# Patient Record
Sex: Male | Born: 1960 | Race: Black or African American | Hispanic: No | Smoking: Current every day smoker
Health system: Southern US, Community
[De-identification: ages and names within clinical notes are randomized; demographics above are authoritative.]

## PROBLEM LIST (undated history)

## (undated) DIAGNOSIS — Z972 Presence of dental prosthetic device (complete) (partial): Secondary | ICD-10-CM

## (undated) DIAGNOSIS — M549 Dorsalgia, unspecified: Secondary | ICD-10-CM

## (undated) DIAGNOSIS — F1910 Other psychoactive substance abuse, uncomplicated: Secondary | ICD-10-CM

## (undated) DIAGNOSIS — N200 Calculus of kidney: Secondary | ICD-10-CM

## (undated) DIAGNOSIS — G8929 Other chronic pain: Secondary | ICD-10-CM

## (undated) DIAGNOSIS — M25569 Pain in unspecified knee: Secondary | ICD-10-CM

## (undated) DIAGNOSIS — K08109 Complete loss of teeth, unspecified cause, unspecified class: Secondary | ICD-10-CM

## (undated) DIAGNOSIS — E785 Hyperlipidemia, unspecified: Secondary | ICD-10-CM

## (undated) DIAGNOSIS — B159 Hepatitis A without hepatic coma: Secondary | ICD-10-CM

## (undated) HISTORY — DX: Hyperlipidemia, unspecified: E78.5

## (undated) HISTORY — DX: Calculus of kidney: N20.0

## (undated) HISTORY — DX: Complete loss of teeth, unspecified cause, unspecified class: K08.109

## (undated) HISTORY — DX: Other chronic pain: G89.29

## (undated) HISTORY — PX: TONSILLECTOMY: SUR1361

## (undated) HISTORY — DX: Dorsalgia, unspecified: M54.9

## (undated) HISTORY — DX: Presence of dental prosthetic device (complete) (partial): Z97.2

---

## 2007-01-28 ENCOUNTER — Emergency Department (HOSPITAL_COMMUNITY): Admission: EM | Admit: 2007-01-28 | Discharge: 2007-01-28 | Payer: Self-pay | Admitting: Family Medicine

## 2008-09-24 ENCOUNTER — Emergency Department (HOSPITAL_COMMUNITY): Admission: EM | Admit: 2008-09-24 | Discharge: 2008-09-24 | Payer: Self-pay | Admitting: Emergency Medicine

## 2008-09-24 ENCOUNTER — Ambulatory Visit: Payer: Self-pay | Admitting: Cardiology

## 2009-03-19 ENCOUNTER — Emergency Department (HOSPITAL_COMMUNITY): Admission: EM | Admit: 2009-03-19 | Discharge: 2009-03-19 | Payer: Self-pay | Admitting: Emergency Medicine

## 2009-09-07 ENCOUNTER — Emergency Department (HOSPITAL_COMMUNITY): Admission: EM | Admit: 2009-09-07 | Discharge: 2009-09-07 | Payer: Self-pay | Admitting: Emergency Medicine

## 2010-01-06 ENCOUNTER — Emergency Department (HOSPITAL_COMMUNITY): Admission: EM | Admit: 2010-01-06 | Discharge: 2010-01-06 | Payer: Self-pay | Admitting: Emergency Medicine

## 2010-02-28 IMAGING — CR DG CHEST 1V PORT
1 series · 1 of 1 positions shown · non-contrast
Comparison: None

CLINICAL DATA: Abdominal pain, nausea

PORTABLE CHEST - 1 VIEW

[AP]
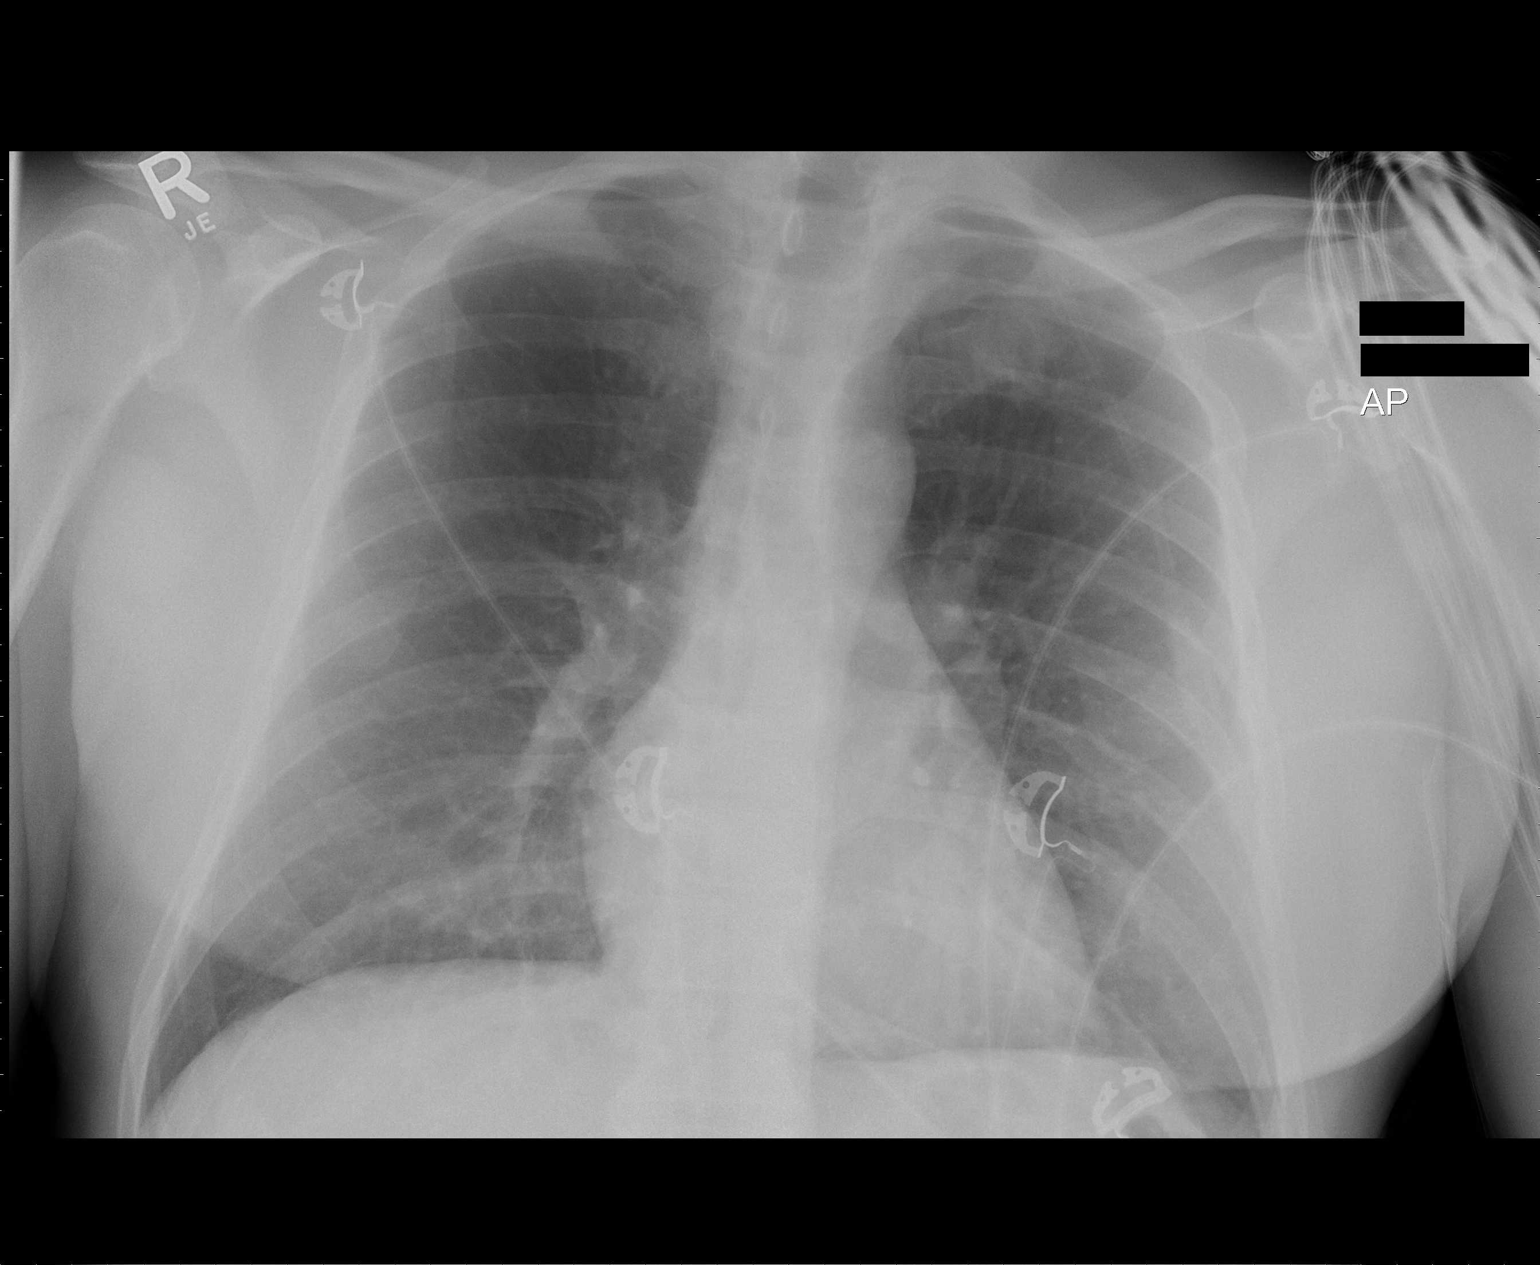

[1 of 1 positions shown; findings below may reference images not displayed]

FINDINGS: Cardiomediastinal silhouette is unremarkable.  No acute
infiltrate or pleural effusion.  No pulmonary edema.
IMPRESSION: No active disease.

## 2010-11-21 LAB — POCT I-STAT, CHEM 8
BUN: 12 mg/dL (ref 6–23)
Chloride: 103 mEq/L (ref 96–112)
Creatinine, Ser: 1.1 mg/dL (ref 0.4–1.5)
Glucose, Bld: 96 mg/dL (ref 70–99)
HCT: 46 % (ref 39.0–52.0)
Hemoglobin: 15.6 g/dL (ref 13.0–17.0)
Potassium: 4.3 mEq/L (ref 3.5–5.1)
Sodium: 139 mEq/L (ref 135–145)

## 2010-12-20 LAB — CBC
HCT: 44.6 % (ref 39.0–52.0)
Hemoglobin: 14.9 g/dL (ref 13.0–17.0)
MCHC: 33.2 g/dL (ref 30.0–36.0)
MCHC: 33.9 g/dL (ref 30.0–36.0)
Platelets: 253 10*3/uL (ref 150–400)
Platelets: 257 10*3/uL (ref 150–400)
RBC: 4.99 MIL/uL (ref 4.22–5.81)
WBC: 7.7 10*3/uL (ref 4.0–10.5)
WBC: 7.9 10*3/uL (ref 4.0–10.5)

## 2010-12-20 LAB — COMPREHENSIVE METABOLIC PANEL
AST: 28 U/L (ref 0–37)
BUN: 15 mg/dL (ref 6–23)
CO2: 27 mEq/L (ref 19–32)
Glucose, Bld: 97 mg/dL (ref 70–99)
Total Bilirubin: 0.7 mg/dL (ref 0.3–1.2)

## 2010-12-20 LAB — TROPONIN I: Troponin I: <0.01 ng/mL (ref 0.00–0.06)

## 2010-12-20 LAB — POCT CARDIAC MARKERS
CKMB, poc: 2.5 ng/mL (ref 1.0–8.0)
Myoglobin, poc: 78.1 ng/mL (ref 12–200)

## 2010-12-20 LAB — LIPASE, BLOOD: Lipase: 15 U/L (ref 11–59)

## 2010-12-20 LAB — CK TOTAL AND CKMB (NOT AT ARMC): Total CK: 581 U/L — ABNORMAL HIGH (ref 7–232)

## 2010-12-20 LAB — GLUCOSE, CAPILLARY: Glucose-Capillary: 127 mg/dL — ABNORMAL HIGH (ref 70–99)

## 2010-12-20 LAB — AMYLASE: Amylase: 74 U/L (ref 27–131)

## 2010-12-20 LAB — DIFFERENTIAL
Basophils Relative: 0 % (ref 0–1)
Eosinophils Relative: 2 % (ref 0–5)
Neutro Abs: 6.5 10*3/uL (ref 1.7–7.7)
Neutrophils Relative %: 83 % — ABNORMAL HIGH (ref 43–77)

## 2010-12-20 LAB — PROTIME-INR
INR: 1 (ref 0.00–1.49)
Prothrombin Time: 13.6 seconds (ref 11.6–15.2)

## 2010-12-20 LAB — TSH: TSH: 1.175 u[IU]/mL (ref 0.350–4.500)

## 2011-01-18 NOTE — H&P (Signed)
NAME:  AVA, DEGUIRE NO.:  [id number]   MEDICAL RECORD NO.:  [id number]          PATIENT TYPE:  EMS   LOCATION:  [facility]                         FACILITY:  [facility]   PHYSICIAN:  Arturo Morton. Riley Kill, MD, FACCDATE OF BIRTH:  08/11/1961   DATE OF ADMISSION:  09/24/2008  DATE OF DISCHARGE:                              HISTORY & PHYSICAL   CHIEF COMPLAINT:  Epigastric pain.   HISTORY OF PRESENT ILLNESS:  Mr. 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  He had  sudden onset of some epigastric discomfort.  He had not had this in the  past.  He has no prior history.  He became nauseated, sweating.  This  radiated around the left lower rib cage and left upper quadrant.  It is  now much better.  We gave him a GI cocktail in the emergency room as  well as chewable aspirin.  The electrocardiogram in the emergency room  demonstrates normal sinus rhythm.  There is J-point elevation noted in  V1, V2.  V2 is approximately just over a millimeter, and V1 is  approximately a millimeter.  The remaining leads have no significant ST  elevation.  There is T-wave inversion in III and biphasicity in aVF,  although these are normal findings.  The EKG is not felt to be  diagnostic, first set of cardiac enzymes done here in the emergency room  promptly did not reveal an elevated myoglobin.  His symptoms are  markedly improved with the GI cocktail.  He is being admitted for  further evaluation and rule out MI.   Past medical history is remarkable for a history of hypertension.  He  said he did lifestyle changes and this improved.  He has not had his  cholesterol rechecked.  He has no primary care physician.  The patient  has not had prior surgery and has no allergies.  He currently takes no  medications.   FAMILY HISTORY:  His father is in his 67s and has had an MI in the past.  His mother died of complications of alcoholism.  He has one  twin brother  who is in good health.   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  He smokes about half-  a-pack per day.  He has a girlfriend, and they have 36-year-old and 8-  year-olds.  He has an occasional beer.   REVIEW OF SYSTEMS:  The patient has not had GI bleeding or major GI  problems.  He has not had exertional chest pain in the past.  He has not  had cough, fever, diarrhea, or other major symptoms.  His hearing is  good, although he says his girlfriend would argue with that.  His vision  has been without difficulty.  A complete review of systems is otherwise  negative.   PHYSICAL EXAMINATION:  GENERAL:  He is alert, oriented, thin African  American male in no acute distress.  VITAL SIGNS:  The blood pressure is 100/70, the pulse is 67, the  temperature is afebrile, the respiratory  rate is normal at 16.  NECK:  The jugular veins are not significantly distended.  The carotid  upstrokes are brisk.  There is no bruit.  HEENT:  There is facial acne particularly under the left mandible.  Uvula is midline.  Extraocular muscles are intact.  Pupils are equal and  reactive to light and accommodation.  The sclerae are clear.  The  external ear canals are not examined.  They are normal set.  His facial  features are normal.  Cranial nerves are intact grossly.  LUNGS:  The lung fields are clear to auscultation and percussion.  HEART:  The PMI is nondisplaced.  There is no significant murmur, rub,  or gallop noted.  ABDOMEN:  Good bowel sounds.  There is some slight tenderness in the  midepigastric area that radiates down to the left upper quadrant.  The  femoral pulses are intact.  Distal pulses are intact.   The electrocardiogram demonstrates normal sinus rhythm.  PR interval is  140 milliseconds, QRS duration 94, QTc is 372 milliseconds.  There is  mild J-point elevation in V1 and V2 less than V3.  These are normal  variants.  T-wave inversion in III is also normal  variant.   Initial myoglobin is 78.1 with a CK-MB of 2.5, hemoglobin of 15.1,  hematocrit of 44.6, and platelet count 257.  Amylase and lipase are  pending.   IMPRESSION:  1. Acute epigastric discomfort, now improved, question etiology.  2. Borderline EKG, probable normal variant.  3. Questionable history of hypertension in the past.  4. History of hypercholesterolemia.   PLAN:  1. We will admit to the CCU for close observation.  2. Serial cardiac enzymes will be obtained.  3. The patient will be started on a low-dose heparin drip and has been      given chewable aspirin.  4. Lipase and amylase will be obtained.  5. Portable chest x-ray will be obtained as well.  6. He will be started on Protonix and given p.r.n. Zofran.      Arturo Morton. Riley Kill, MD, [facility]  Electronically Signed     TDS/MEDQ  D:  09/24/2008  T:  09/25/2008  Job:  [id number]

## 2011-02-10 ENCOUNTER — Encounter: Payer: Self-pay | Admitting: Internal Medicine

## 2011-02-10 ENCOUNTER — Ambulatory Visit (INDEPENDENT_AMBULATORY_CARE_PROVIDER_SITE_OTHER): Payer: 59 | Admitting: Internal Medicine

## 2011-02-10 DIAGNOSIS — R1032 Left lower quadrant pain: Secondary | ICD-10-CM

## 2011-02-10 DIAGNOSIS — Z0000 Encounter for general adult medical examination without abnormal findings: Secondary | ICD-10-CM

## 2011-02-10 DIAGNOSIS — R413 Other amnesia: Secondary | ICD-10-CM

## 2011-02-10 DIAGNOSIS — N529 Male erectile dysfunction, unspecified: Secondary | ICD-10-CM

## 2011-02-10 DIAGNOSIS — R1031 Right lower quadrant pain: Secondary | ICD-10-CM

## 2011-02-10 DIAGNOSIS — M549 Dorsalgia, unspecified: Secondary | ICD-10-CM

## 2011-02-10 DIAGNOSIS — R109 Unspecified abdominal pain: Secondary | ICD-10-CM

## 2011-02-10 DIAGNOSIS — N200 Calculus of kidney: Secondary | ICD-10-CM

## 2011-02-10 DIAGNOSIS — M25562 Pain in left knee: Secondary | ICD-10-CM

## 2011-02-10 DIAGNOSIS — M25569 Pain in unspecified knee: Secondary | ICD-10-CM

## 2011-02-10 DIAGNOSIS — G8929 Other chronic pain: Secondary | ICD-10-CM

## 2011-02-10 LAB — POCT URINALYSIS DIPSTICK
Blood, UA: NEGATIVE
Protein, UA: NEGATIVE
Spec Grav, UA: 1.03
Urobilinogen, UA: 0.2

## 2011-02-10 MED ORDER — VARDENAFIL HCL 10 MG PO TABS: ORAL_TABLET | ORAL | Status: DC

## 2011-02-10 MED ORDER — MELOXICAM 7.5 MG PO TABS: ORAL_TABLET | ORAL | Status: AC

## 2011-02-11 LAB — CBC WITH DIFFERENTIAL/PLATELET
Basophils Relative: 0.3 % (ref 0.0–3.0)
Eosinophils Absolute: 0.2 10*3/uL (ref 0.0–0.7)
Eosinophils Relative: 3.7 % (ref 0.0–5.0)
HCT: 39.7 % (ref 39.0–52.0)
Lymphocytes Relative: 41.6 % (ref 12.0–46.0)
Lymphs Abs: 2.7 10*3/uL (ref 0.7–4.0)
MCV: 89.8 fl (ref 78.0–100.0)
Monocytes Relative: 6.6 % (ref 3.0–12.0)
Neutrophils Relative %: 47.8 % (ref 43.0–77.0)
WBC: 6.5 10*3/uL (ref 4.5–10.5)

## 2011-02-11 LAB — PSA: PSA: 0.56 ng/mL (ref 0.10–4.00)

## 2011-02-11 LAB — BASIC METABOLIC PANEL
GFR: 89.87 mL/min (ref >60.00)
Sodium: 140 mEq/L (ref 135–145)

## 2011-02-11 LAB — LIPID PANEL
Cholesterol: 269 mg/dL — ABNORMAL HIGH (ref 0–200)
HDL: 57.7 mg/dL (ref >39.00)
VLDL: 48.6 mg/dL — ABNORMAL HIGH (ref 0.0–40.0)

## 2011-02-11 LAB — VITAMIN B12: Vitamin B-12: 432 pg/mL (ref 211–911)

## 2011-02-11 LAB — RPR

## 2011-02-11 LAB — TSH: TSH: 0.32 u[IU]/mL — ABNORMAL LOW (ref 0.35–5.50)

## 2011-02-11 LAB — HEPATIC FUNCTION PANEL: Alkaline Phosphatase: 55 U/L (ref 39–117)

## 2011-02-11 LAB — LDL CHOLESTEROL, DIRECT: Direct LDL: 126.2 mg/dL

## 2011-02-12 ENCOUNTER — Encounter: Payer: Self-pay | Admitting: Internal Medicine

## 2011-02-12 DIAGNOSIS — N200 Calculus of kidney: Secondary | ICD-10-CM | POA: Insufficient documentation

## 2011-02-12 DIAGNOSIS — M25562 Pain in left knee: Secondary | ICD-10-CM | POA: Insufficient documentation

## 2011-02-12 DIAGNOSIS — N529 Male erectile dysfunction, unspecified: Secondary | ICD-10-CM | POA: Insufficient documentation

## 2011-02-12 DIAGNOSIS — Z0000 Encounter for general adult medical examination without abnormal findings: Secondary | ICD-10-CM | POA: Insufficient documentation

## 2011-02-12 DIAGNOSIS — R413 Other amnesia: Secondary | ICD-10-CM | POA: Insufficient documentation

## 2011-02-12 DIAGNOSIS — M549 Dorsalgia, unspecified: Secondary | ICD-10-CM | POA: Insufficient documentation

## 2011-02-12 DIAGNOSIS — G8929 Other chronic pain: Secondary | ICD-10-CM | POA: Insufficient documentation

## 2011-02-12 DIAGNOSIS — R1031 Right lower quadrant pain: Secondary | ICD-10-CM | POA: Insufficient documentation

## 2011-02-12 NOTE — Progress Notes (Signed)
  Subjective:    Patient ID: Timothy Melton, male    DOB: 01-17-1961, 50 y.o.   MRN: [id number]  HPI Pt presents to clinic to establish care and for CPE. Notes chronic back pain x ~5 years without injury/trauma. Pain worse with lifting and is intermittent. S/p chiropractic care but no other evaluation. Denies radicular leg pain or leg weakness but does not intermittent leg numbness. H/o intermittent left knee pain and swelling without h/o injury. No instability. Pain located primarily in medial aspect. Wife notes progressive mild memory loss x 3-4 months without neurologic deficit. Leaves lights on in car or leaves house front door unlocked.  No exacerbating or alleviating factors. Has recent bilateral lower abdominal pain intermittently. + associated nausea and describes discomfort as cramp like. Not affected by food or bowel movements. C/o diminished erections with intact libidio. No other complaints.  Reviewed pmh, psh, medications, allergies, social hx and family hx.    Review of Systems  Constitutional: Negative for fever, fatigue and unexpected weight change.  Respiratory: Negative for cough and shortness of breath.   Cardiovascular: Negative for chest pain and palpitations.  Gastrointestinal: Positive for nausea and abdominal pain. Negative for vomiting, constipation and blood in stool.  Musculoskeletal: Positive for back pain and arthralgias.  Neurological: Positive for numbness. Negative for weakness.  All other systems reviewed and are negative.       Objective:   Physical Exam  Nursing note and vitals reviewed. Constitutional: He appears well-developed and well-nourished. No distress.  HENT:  Head: Normocephalic and atraumatic.  Right Ear: External ear normal.  Left Ear: External ear normal.  Nose: Nose normal.  Mouth/Throat: Oropharynx is clear and moist. No oropharyngeal exudate.  Eyes: Conjunctivae and EOM are normal. Pupils are equal, round, and reactive to light. Right  eye exhibits no discharge. Left eye exhibits no discharge. No scleral icterus.  Neck: Normal range of motion. Neck supple. No JVD present. No thyromegaly present.  Cardiovascular: Normal rate, regular rhythm and normal heart sounds.  Exam reveals no gallop and no friction rub.   No murmur heard. Pulmonary/Chest: Effort normal and breath sounds normal. No respiratory distress. He has no wheezes. He has no rales.  Abdominal: Soft. Bowel sounds are normal. He exhibits no distension. There is no tenderness. There is no rebound and no guarding.  Genitourinary: Rectum normal and prostate normal. Guaiac negative stool.  Musculoskeletal:       No midline ls tenderness or bony abn. Bilateral le strength 5/5. slr negative bilaterally. Left knee without erythema, warmth or effusion. + tenderness medial lower aspect without bony abn.   Lymphadenopathy:    He has no cervical adenopathy.  Neurological: He is alert. No cranial nerve deficit.  Skin: Skin is warm and dry. No rash noted. He is not diaphoretic. No erythema.  Psychiatric: He has a normal mood and affect.          Assessment & Plan:

## 2011-02-12 NOTE — Assessment & Plan Note (Signed)
Neurologically nonfocal. Obtain B12, TSH and RPR. Followup if no improvement or worsening.

## 2011-02-12 NOTE — Assessment & Plan Note (Signed)
Begin lab evaluation and schedule GI consult for consideration of colonoscopy

## 2011-02-12 NOTE — Assessment & Plan Note (Signed)
Obtain knee radiograph and begin mobic prn with food and no other nsaids. Followup if no improvement or worsening

## 2011-02-12 NOTE — Assessment & Plan Note (Signed)
Exam nl. Obtain screening labs including psa (discussed potential risks and benefits) Schedule GI appt for colonoscopy.

## 2011-02-12 NOTE — Assessment & Plan Note (Signed)
Obtain plain ls radiograph and begin mobic prn. Followup if no improvement or worsening.

## 2011-02-12 NOTE — Assessment & Plan Note (Signed)
Attempt levitra 10mg  prn. Dosing instructions and potential side effects reviewed.

## 2011-02-14 ENCOUNTER — Telehealth: Payer: Self-pay | Admitting: Internal Medicine

## 2011-02-14 ENCOUNTER — Other Ambulatory Visit: Payer: Self-pay | Admitting: Internal Medicine

## 2011-02-14 NOTE — Telephone Encounter (Signed)
Timothy Melton will notify the pt of his appt.

## 2011-02-14 NOTE — Telephone Encounter (Signed)
LMOM for Timothy Melton at Dr Hodgin's ofc to call back. I scheduled pt for an appt for Wednesday, 02/16/11 at 1:45pm, arrive at 1:30pm.

## 2011-02-15 ENCOUNTER — Encounter: Payer: Self-pay | Admitting: *Deleted

## 2011-02-15 ENCOUNTER — Telehealth: Payer: Self-pay

## 2011-02-15 NOTE — Telephone Encounter (Signed)
Message copied by Beverely Low on Tue Feb 15, 2011  4:47 PM ------      Message from: Staci Righter      Created: Mon Feb 14, 2011  8:01 PM       Thyroid test was abn. Return to lab for further testing.  Low fat diet and exercise for cholesterol. Other labs ok.

## 2011-02-15 NOTE — Telephone Encounter (Signed)
Pt's wife notified. Lab appt made

## 2011-02-16 ENCOUNTER — Ambulatory Visit: Payer: 59 | Admitting: Internal Medicine

## 2011-02-17 ENCOUNTER — Other Ambulatory Visit: Payer: 59

## 2011-02-18 ENCOUNTER — Other Ambulatory Visit (INDEPENDENT_AMBULATORY_CARE_PROVIDER_SITE_OTHER): Payer: 59

## 2011-02-18 DIAGNOSIS — R946 Abnormal results of thyroid function studies: Secondary | ICD-10-CM

## 2011-02-21 ENCOUNTER — Telehealth: Payer: Self-pay | Admitting: *Deleted

## 2011-02-21 NOTE — Telephone Encounter (Signed)
Pt states forms were left for Dr. Rodena Medin to complete, checking status.

## 2011-02-21 NOTE — Telephone Encounter (Signed)
Left message for pt to call back if referring to forms other than FMLA documents that Dr. Rodena Medin completed last week. Pt was notified that those forms were ready for him to pick up

## 2011-02-23 ENCOUNTER — Telehealth: Payer: Self-pay

## 2011-02-23 NOTE — Telephone Encounter (Signed)
Left message to notify pt repeat thyroid test nl

## 2011-02-23 NOTE — Telephone Encounter (Signed)
Left another message for pt to call back if the paperwork referred to in the phone note is something different than the FMLA documents completed last week

## 2011-02-23 NOTE — Telephone Encounter (Signed)
Message copied by Beverely Low on Wed Feb 23, 2011  3:19 PM ------      Message from: Staci Righter      Created: Fri Feb 18, 2011  5:27 PM       Repeat thyroid tests nl

## 2011-03-29 ENCOUNTER — Encounter: Payer: Self-pay | Admitting: Internal Medicine

## 2011-03-29 ENCOUNTER — Ambulatory Visit (INDEPENDENT_AMBULATORY_CARE_PROVIDER_SITE_OTHER): Payer: 59 | Admitting: Internal Medicine

## 2011-03-29 VITALS — BP 110/72 | HR 92 | Ht 64.0 in | Wt 165.8 lb

## 2011-03-29 DIAGNOSIS — Z1211 Encounter for screening for malignant neoplasm of colon: Secondary | ICD-10-CM

## 2011-03-29 DIAGNOSIS — R1084 Generalized abdominal pain: Secondary | ICD-10-CM

## 2011-03-29 MED ORDER — PEG-KCL-NACL-NASULF-NA ASC-C 100 G PO SOLR: 1.0000 | Freq: Once | ORAL | Status: DC

## 2011-03-29 NOTE — Progress Notes (Signed)
HISTORY OF PRESENT ILLNESS:  Timothy Melton is a 50 y.o. male with a history of kidney stones. He presents today, and consultative referral, regarding transient abdominal discomfort and the need for colonoscopy. The patient reports having had a 1-2 day history of lower abdominal cramping discomfort, in early June 2012. This has resolved without recurrence. He thought possibly a kidney stone. He denies any change in bowel habits or bleeding. No weight loss. His GI review of symptoms is currently entirely negative.  REVIEW OF SYSTEMS:  All non-GI ROS negative except for intermittent back pain  Past Medical History  Diagnosis Date  . Kidney stones   . Chronic back pain   . Memory loss     Past Surgical History  Procedure Date  . Tonsillectomy     Social History [name]  reports that he has been smoking Cigarettes.  He has been smoking about 1 pack per day. He has never used smokeless tobacco. He reports that he drinks alcohol. He reports that he does not use illicit drugs.  family history includes Alcohol abuse in his mother; Diabetes in his father; Heart disease in his father; Hypertension in his father; and Stroke in his father.  No Known Allergies     PHYSICAL EXAMINATION: Vital signs: BP 110/72  Pulse 92  Ht 5\' 4"  (1.626 m)  Wt 165 lb 12.8 oz (75.206 kg)  BMI 28.46 kg/m2  Constitutional: generally well-appearing, no acute distress Psychiatric: alert and oriented x3, cooperative Eyes: extraocular movements intact, anicteric, conjunctiva pink Mouth: oral pharynx moist, no lesions Neck: supple no lymphadenopathy Cardiovascular: heart regular rate and rhythm, no murmur Lungs: clear to auscultation bilaterally Abdomen: soft, nontender, nondistended, no obvious ascites, no peritoneal signs, normal bowel sounds, no organomegaly Rectal: Deferred until colonoscopy Extremities: no lower extremity edema bilaterally Skin: no lesions on visible extremities Neuro: No focal  deficits.   ASSESSMENT:  #1. Transient short-lived lower abdominal discomfort without recurrence. Nonspecific. Recommend observation for evidence of recurrence. Plans for screening colonoscopy #2. Colon cancer screening. Baseline risk. Appropriate candidate without contraindication   PLAN:  #1. Colonoscopy.The nature of the procedure, as well as the risks, benefits, and alternatives were carefully and thoroughly reviewed with the patient. Ample time for discussion and questions allowed. The patient understood, was satisfied, and agreed to proceed. Movi prep prescribed. The patient instructed on its use

## 2011-03-29 NOTE — Patient Instructions (Signed)
Colonoscopy [facility] 05/12/11 11:30 am arrive at 10:30 am on 4th floor Moviprep sent to your pharmacy. Colonoscopy brochure given to you for review.

## 2011-05-12 ENCOUNTER — Other Ambulatory Visit: Payer: 59 | Admitting: Internal Medicine

## 2011-07-31 ENCOUNTER — Emergency Department (HOSPITAL_COMMUNITY)
Admission: EM | Admit: 2011-07-31 | Discharge: 2011-07-31 | Disposition: A | Discharge status: Home / Self Care | Payer: 59 | Attending: Emergency Medicine | Admitting: Emergency Medicine

## 2011-07-31 ENCOUNTER — Emergency Department (HOSPITAL_COMMUNITY): Payer: 59

## 2011-07-31 ENCOUNTER — Encounter (HOSPITAL_COMMUNITY): Payer: Self-pay | Admitting: Emergency Medicine

## 2011-07-31 DIAGNOSIS — M25562 Pain in left knee: Secondary | ICD-10-CM

## 2011-07-31 DIAGNOSIS — F17200 Nicotine dependence, unspecified, uncomplicated: Secondary | ICD-10-CM | POA: Insufficient documentation

## 2011-07-31 DIAGNOSIS — M25569 Pain in unspecified knee: Secondary | ICD-10-CM | POA: Insufficient documentation

## 2011-07-31 MED ORDER — OXYCODONE-ACETAMINOPHEN 5-325 MG PO TABS: 1.0000 | ORAL_TABLET | ORAL | Status: AC | PRN

## 2011-07-31 MED ORDER — NAPROXEN 500 MG PO TABS: 500.0000 mg | ORAL_TABLET | Freq: Two times a day (BID) | ORAL | Status: DC

## 2011-07-31 NOTE — ED Provider Notes (Signed)
History     CSN: [id number] Arrival date & time: 07/31/2011  8:37 AM   First MD Initiated Contact with Patient 07/31/11 [phone]      Chief Complaint  Patient presents with  . Knee Pain    (Consider location/radiation/quality/duration/timing/severity/associated sxs/prior treatment) Patient is a 50 y.o. male presenting with knee pain. The history is provided by the patient.  Knee Pain  He has had pain in his left knee intermittently for several years, but it has been significantly worse over the last week. Pain is sharp and located in the medial aspect of the knee. It sometimes wakes him up from sleep. It is worse with walking and worse with climbing steps. He does relate that he thinks he had injured his knee playing sports although does not recall a specific injury. He was given an anti-inflammatory medication which initially was helping, but is not helping anymore. He showed me the bottle, and the anti-inflammatory medication as meloxicam 7.5 mg. Pain in the knee is described as severe and he currently rates the pain at 10/10.  Past Medical History  Diagnosis Date  . Kidney stones   . Chronic back pain   . Memory loss     Past Surgical History  Procedure Date  . Tonsillectomy     Family History  Problem Relation Age of Onset  . Alcohol abuse Mother   . Stroke Father   . Heart disease Father   . Diabetes Father   . Hypertension Father     History  Substance Use Topics  . Smoking status: Current Everyday Smoker -- 1.0 packs/day    Types: Cigarettes  . Smokeless tobacco: Never Used  . Alcohol Use: Yes      Review of Systems  All other systems reviewed and are negative.    Allergies  Review of patient's allergies indicates no known allergies.  Home Medications   Current Outpatient Rx  Name Route Sig Dispense Refill  . IBUPROFEN 200 MG PO TABS Oral Take 600 mg by mouth every 6 (six) hours as needed. Knee pain     . MELOXICAM 7.5 MG PO TABS Oral Take 7.5 mg by  mouth daily. Pain in knee       BP 143/77  Pulse 82  Temp(Src) 97.8 F (36.6 C) (Oral)  Resp 20  SpO2 99%  Physical Exam  Nursing note and vitals reviewed.  50 year old male resting comfortably and in no acute distress. Vital signs are normal. Head is normocephalic and atraumatic. PERRLA, EOMI. Neck is supple without adenopathy. Lungs are clear without rales, wheezes, rhonchi. Back is nontender. Heart has regular rate and rhythm without murmur. Abdomen is soft, flat, and nontender without masses or hepatosplenomegaly. Extremities: No effusion is seen in the left knee. There is tenderness in the left knee which is well localized to the medial aspect of the proximal tibia. There is no instability noted and in Lachman and McMurray's tests are negative. Remainder of extremity exam is normal. Neurologic: Mental status is normal, cranial nerves are intact. There are no focal motor or sensory deficits. Psychiatric: No abnormalities of mood or affect.  ED Course  Procedures (including critical care time)  Labs Reviewed - No data to display No results found. Results for orders placed in visit on 02/18/11  TSH      Component Value Range   TSH 0.70  0.35 - 5.50 (uIU/mL)  T4, FREE      Component Value Range   Free T4 0.77  0.60 -  1.60 (ng/dL)   Dg Knee Complete 4 Views Left  07/31/2011  *RADIOLOGY REPORT*  Clinical Data: 50 year old male with medial left knee pain.  LEFT KNEE - COMPLETE 4+ VIEW  Comparison: 09/07/2009  Findings: Mild joint space narrowing in the medial compartment is identified. The lateral and patellofemoral compartments are within normal limits. There is no evidence of acute fracture, subluxation or dislocation. There is no evidence of knee effusion. No focal bony lesions are present.  IMPRESSION: Mild medial compartment degenerative changes without other significant abnormality.  Original Report Authenticated By: Rosendo Gros, M.D.      No diagnosis found.    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        Dione Booze, MD 07/31/11 [phone]

## 2011-07-31 NOTE — ED Notes (Signed)
Pt returned from xray

## 2011-07-31 NOTE — ED Notes (Signed)
Pt presenting to ed with c/o left knee pain x 1 week pt states pain has been intermittent x 3 years. Pt denies injury to knee. Pt states he thinks the pain is old age

## 2012-01-31 ENCOUNTER — Encounter (HOSPITAL_COMMUNITY): Payer: Self-pay

## 2012-01-31 ENCOUNTER — Emergency Department (HOSPITAL_COMMUNITY)
Admission: EM | Admit: 2012-01-31 | Discharge: 2012-01-31 | Disposition: A | Discharge status: Home / Self Care | Payer: 59 | Attending: Emergency Medicine | Admitting: Emergency Medicine

## 2012-01-31 DIAGNOSIS — M549 Dorsalgia, unspecified: Secondary | ICD-10-CM | POA: Insufficient documentation

## 2012-01-31 DIAGNOSIS — M25569 Pain in unspecified knee: Secondary | ICD-10-CM | POA: Insufficient documentation

## 2012-01-31 DIAGNOSIS — M25469 Effusion, unspecified knee: Secondary | ICD-10-CM | POA: Insufficient documentation

## 2012-01-31 DIAGNOSIS — G8929 Other chronic pain: Secondary | ICD-10-CM | POA: Insufficient documentation

## 2012-01-31 DIAGNOSIS — M25562 Pain in left knee: Secondary | ICD-10-CM

## 2012-01-31 HISTORY — DX: Pain in unspecified knee: M25.569

## 2012-01-31 MED ORDER — HYDROCODONE-ACETAMINOPHEN 5-325 MG PO TABS: 2.0000 | ORAL_TABLET | ORAL | Status: AC | PRN

## 2012-01-31 NOTE — ED Notes (Signed)
Correction in documenation. Pain is located in the left knee not right.

## 2012-01-31 NOTE — ED Notes (Signed)
Pt c/o of right knee pain that started 3 days ago and right leg swelling starting yesterday. Advil and Ibuprofen taken with no relief currently.

## 2012-01-31 NOTE — ED Provider Notes (Signed)
History     CSN: [id number]  Arrival date & time 01/31/12  1039   First MD Initiated Contact with Patient 01/31/12 1111      Chief Complaint  Patient presents with  . Leg Swelling  . Knee Pain    (Consider location/radiation/quality/duration/timing/severity/associated sxs/prior treatment) HPI Comments: Patient reports that he has had recurrent left knee pain for years.  Pain worse over the past week.  Pain worse in the morning.  Pain associated with stiffness.  He also reports that he has had intermittent swelling of the medial aspect of the left knee.  He has tried taking NSAIDS for pain, but does not feel that it is helping.  He has been wearing a knee brace, which he feels is helping.  No erythema or warmth of the knee.  Mild swelling of the medial portion of the knee.  No recent injury or trauma.  No fever or chills.    Patient is a 51 y.o. male presenting with knee pain. The history is provided by the patient.  Knee Pain This is a recurrent problem. The problem has been gradually worsening. Associated symptoms include joint swelling. Pertinent negatives include no chills, fever or numbness. The symptoms are aggravated by bending (walking up stairs). He has tried acetaminophen and NSAIDs for the symptoms.    Past Medical History  Diagnosis Date  . Kidney stones   . Chronic back pain   . Memory loss   . Knee pain     Past Surgical History  Procedure Date  . Tonsillectomy     Family History  Problem Relation Age of Onset  . Alcohol abuse Mother   . Stroke Father   . Heart disease Father   . Diabetes Father   . Hypertension Father     History  Substance Use Topics  . Smoking status: Current Everyday Smoker -- 1.0 packs/day    Types: Cigarettes  . Smokeless tobacco: Never Used  . Alcohol Use: Yes      Review of Systems  Constitutional: Negative for fever and chills.  Musculoskeletal: Positive for joint swelling. Negative for gait problem.       Knee pain    Skin: Negative for color change and wound.  Neurological: Negative for numbness.    Allergies  Review of patient's allergies indicates no known allergies.  Home Medications   Current Outpatient Rx  Name Route Sig Dispense Refill  . IBUPROFEN 200 MG PO TABS Oral Take 400 mg by mouth every 6 (six) hours as needed. Pain      BP 102/52  Temp(Src) 98.3 F (36.8 C) (Oral)  Ht 5\' 6"  (1.676 m)  Wt 160 lb (72.576 kg)  BMI 25.82 kg/m2  SpO2 99%  Physical Exam  Nursing note and vitals reviewed. Constitutional: He appears well-developed and well-nourished.  HENT:  Head: Normocephalic and atraumatic.  Neck: Normal range of motion. Neck supple.  Cardiovascular: Normal rate, regular rhythm and normal heart sounds.   Pulses:      Dorsalis pedis pulses are 2+ on the right side, and 2+ on the left side.  Pulmonary/Chest: Effort normal and breath sounds normal.  Musculoskeletal: Normal range of motion.       Left knee: He exhibits normal range of motion, no swelling, no effusion, no ecchymosis, no deformity, no erythema and no bony tenderness. no tenderness found.  Neurological: He is alert. No sensory deficit. Gait normal.  Skin: Skin is warm and dry. No erythema.  No erythema or warmth of the left knee.  Psychiatric: He has a normal mood and affect.    ED Course  Procedures (including critical care time)  Labs Reviewed - No data to display No results found.   No diagnosis found.    MDM  Patient presenting with left knee pain that has been recurrent for years.  No acute injury or trauma.  Pain associated with stiffness and is worse in the AM.  Full ROM of left knee.  No erythema, edema, or warmth. Therefore, feel that symptoms most likely OA.  Patient given short course of pain medication and instructed to follow up with Ortho.         Pascal Lux Sammy Martinez, PA-C 01/31/12 1840

## 2012-02-01 NOTE — ED Provider Notes (Signed)
Medical screening examination/treatment/procedure(s) were performed by non-physician practitioner and as supervising physician I was immediately available for consultation/collaboration.  Cheri Guppy, MD 02/01/12 [phone]

## 2012-09-12 ENCOUNTER — Encounter (HOSPITAL_COMMUNITY): Payer: Self-pay | Admitting: Emergency Medicine

## 2012-09-12 ENCOUNTER — Emergency Department (HOSPITAL_COMMUNITY)
Admission: EM | Admit: 2012-09-12 | Discharge: 2012-09-12 | Disposition: A | Discharge status: Home / Self Care | Payer: Self-pay | Attending: Emergency Medicine | Admitting: Emergency Medicine

## 2012-09-12 DIAGNOSIS — F17200 Nicotine dependence, unspecified, uncomplicated: Secondary | ICD-10-CM | POA: Insufficient documentation

## 2012-09-12 DIAGNOSIS — Z8659 Personal history of other mental and behavioral disorders: Secondary | ICD-10-CM | POA: Insufficient documentation

## 2012-09-12 DIAGNOSIS — L089 Local infection of the skin and subcutaneous tissue, unspecified: Secondary | ICD-10-CM | POA: Insufficient documentation

## 2012-09-12 DIAGNOSIS — G8929 Other chronic pain: Secondary | ICD-10-CM | POA: Insufficient documentation

## 2012-09-12 DIAGNOSIS — Z87442 Personal history of urinary calculi: Secondary | ICD-10-CM | POA: Insufficient documentation

## 2012-09-12 DIAGNOSIS — Z8739 Personal history of other diseases of the musculoskeletal system and connective tissue: Secondary | ICD-10-CM | POA: Insufficient documentation

## 2012-09-12 MED ORDER — LIDOCAINE-EPINEPHRINE-TETRACAINE (LET) SOLUTION
3.0000 mL | Freq: Once | NASAL | Status: AC
  Administered 2012-09-12: 3 mL via TOPICAL
  Filled 2012-09-12: qty 3

## 2012-09-12 MED ORDER — IBUPROFEN 800 MG PO TABS: 800.0000 mg | ORAL_TABLET | Freq: Three times a day (TID) | ORAL | Status: DC | PRN

## 2012-09-12 MED ORDER — DOXYCYCLINE HYCLATE 100 MG PO CAPS: 100.0000 mg | ORAL_CAPSULE | Freq: Two times a day (BID) | ORAL | Status: DC

## 2012-09-12 NOTE — ED Provider Notes (Signed)
History     CSN: [id number]  Arrival date & time 09/12/12  6578   First MD Initiated Contact with Patient 09/12/12 [phone]      Chief Complaint  Patient presents with  . Cellulitis    (Consider location/radiation/quality/duration/timing/severity/associated sxs/prior treatment) HPI Patient presents to the emergency department with a small swollen area above the right.  Patient states that he tried to use a needle to drain the area, but was unsuccessful.  Patient denies fevers, nausea, vomiting, blurred vision, headache, or numbness.  Patient states that he did not take any other medications prior to arrival for his symptoms.  Palpation seems to make the pain, worse Past Medical History  Diagnosis Date  . Kidney stones   . Chronic back pain   . Memory loss   . Knee pain     Past Surgical History  Procedure Date  . Tonsillectomy     Family History  Problem Relation Age of Onset  . Alcohol abuse Mother   . Stroke Father   . Heart disease Father   . Diabetes Father   . Hypertension Father     History  Substance Use Topics  . Smoking status: Current Every Day Smoker -- 1.0 packs/day    Types: Cigarettes  . Smokeless tobacco: Never Used  . Alcohol Use: Yes      Review of Systems All other systems negative except as documented in the HPI. All pertinent positives and negatives as reviewed in the HPI.  Allergies  Review of patient's allergies indicates no known allergies.  Home Medications   Current Outpatient Rx  Name  Route  Sig  Dispense  Refill  . IBUPROFEN 200 MG PO TABS   Oral   Take 400 mg by mouth every 6 (six) hours as needed. Pain           BP 113/80  Pulse 76  Temp 98.1 F (36.7 C) (Oral)  Resp 17  Wt 160 lb (72.576 kg)  SpO2 100%  Physical Exam  Nursing note and vitals reviewed. Constitutional: He is oriented to person, place, and time. He appears well-developed and well-nourished. No distress.  HENT:  Head: Normocephalic and atraumatic.    Mouth/Throat: Oropharynx is clear and moist.  Eyes: Pupils are equal, round, and reactive to light.  Neck: Normal range of motion. Neck supple.  Pulmonary/Chest: Effort normal. He has no wheezes.  Neurological: He is alert and oriented to person, place, and time.  Skin: Skin is warm and dry. No rash noted.       Patient has a small kidney bean size area of swelling with some mild fluctuance to the right eyebrow area.     ED Course  Procedures (including critical care time)  I performed a needle aspiration of the area after numbing with 2% plain lidocaine was able to aspirate a small amount of purulent material.   MDM  MDM Reviewed: nursing note and vitals            Carlyle Dolly, PA-C 09/12/12 1023

## 2012-09-12 NOTE — ED Notes (Signed)
States that he has a area over his right eye that started as a pimple and has gotten larger in size. States that it is painful upon palpation and tried to "squeeze" the sight. No drainage noted.

## 2012-09-14 NOTE — ED Provider Notes (Signed)
Medical screening examination/treatment/procedure(s) were performed by non-physician practitioner and as supervising physician I was immediately available for consultation/collaboration.    Maicol Bowland L Makiah Foye, MD 09/14/12 1053 

## 2013-04-22 ENCOUNTER — Encounter (HOSPITAL_COMMUNITY): Payer: Self-pay | Admitting: Emergency Medicine

## 2013-04-22 ENCOUNTER — Emergency Department (HOSPITAL_COMMUNITY)
Admission: EM | Admit: 2013-04-22 | Discharge: 2013-04-22 | Disposition: A | Discharge status: Home / Self Care | Payer: Self-pay | Attending: Emergency Medicine | Admitting: Emergency Medicine

## 2013-04-22 DIAGNOSIS — Y9389 Activity, other specified: Secondary | ICD-10-CM | POA: Insufficient documentation

## 2013-04-22 DIAGNOSIS — IMO0002 Reserved for concepts with insufficient information to code with codable children: Secondary | ICD-10-CM | POA: Insufficient documentation

## 2013-04-22 DIAGNOSIS — Z792 Long term (current) use of antibiotics: Secondary | ICD-10-CM | POA: Insufficient documentation

## 2013-04-22 DIAGNOSIS — M549 Dorsalgia, unspecified: Secondary | ICD-10-CM

## 2013-04-22 DIAGNOSIS — G8929 Other chronic pain: Secondary | ICD-10-CM | POA: Insufficient documentation

## 2013-04-22 DIAGNOSIS — Y990 Civilian activity done for income or pay: Secondary | ICD-10-CM | POA: Insufficient documentation

## 2013-04-22 DIAGNOSIS — X500XXA Overexertion from strenuous movement or load, initial encounter: Secondary | ICD-10-CM | POA: Insufficient documentation

## 2013-04-22 DIAGNOSIS — F17200 Nicotine dependence, unspecified, uncomplicated: Secondary | ICD-10-CM | POA: Insufficient documentation

## 2013-04-22 DIAGNOSIS — S0993XA Unspecified injury of face, initial encounter: Secondary | ICD-10-CM | POA: Insufficient documentation

## 2013-04-22 DIAGNOSIS — Z87442 Personal history of urinary calculi: Secondary | ICD-10-CM | POA: Insufficient documentation

## 2013-04-22 DIAGNOSIS — Z8739 Personal history of other diseases of the musculoskeletal system and connective tissue: Secondary | ICD-10-CM | POA: Insufficient documentation

## 2013-04-22 DIAGNOSIS — Y929 Unspecified place or not applicable: Secondary | ICD-10-CM | POA: Insufficient documentation

## 2013-04-22 MED ORDER — PROMETHAZINE HCL 25 MG PO TABS: 25.0000 mg | ORAL_TABLET | Freq: Four times a day (QID) | ORAL | Status: DC | PRN

## 2013-04-22 MED ORDER — HYDROCODONE-ACETAMINOPHEN 5-325 MG PO TABS: 2.0000 | ORAL_TABLET | Freq: Four times a day (QID) | ORAL | Status: DC | PRN

## 2013-04-22 MED ORDER — KETOROLAC TROMETHAMINE 60 MG/2ML IM SOLN
60.0000 mg | Freq: Once | INTRAMUSCULAR | Status: AC
  Administered 2013-04-22: 60 mg via INTRAMUSCULAR
  Filled 2013-04-22: qty 2

## 2013-04-22 MED ORDER — DIAZEPAM 5 MG PO TABS: 5.0000 mg | ORAL_TABLET | Freq: Two times a day (BID) | ORAL | Status: DC

## 2013-04-22 NOTE — ED Notes (Signed)
Pt states he was lifting heavy things on Saturday at work and now is having lower back pain.

## 2013-04-22 NOTE — ED Provider Notes (Signed)
CSN: [id number]     Arrival date & time 04/22/13  1525 History  This chart was scribed for non-physician practitioner, Junious Silk, PA-C working with Nelia Shi, MD by Greggory Stallion, ED scribe. This patient was seen in room WTR9/WTR9 and the patient's care was started at 4:49 PM.   Chief Complaint  Patient presents with  . Back Pain   The history is provided by the patient. No language interpreter was used.    HPI Comments: Timothy Melton is a 52 y.o. male who presents to the Emergency Department complaining of gradual onset, gradually worsening sharp, throbbing lower back pain that radiates to his neck that started 4 days ago after he was lifting heavy objects at work. He states bearing weight worsening the pain. Pt has tried ibuprofen with no relief. He denies bowel incontinence, urinary incontinence, fever, chills, abdominal pain, nausea and emesis as associated symptoms. Pt has never had cancer and does not use recreational drugs.   Past Medical History  Diagnosis Date  . Kidney stones   . Chronic back pain   . Memory loss   . Knee pain    Past Surgical History  Procedure Laterality Date  . Tonsillectomy     Family History  Problem Relation Age of Onset  . Alcohol abuse Mother   . Stroke Father   . Heart disease Father   . Diabetes Father   . Hypertension Father    History  Substance Use Topics  . Smoking status: Current Every Day Smoker -- 1.00 packs/day    Types: Cigarettes  . Smokeless tobacco: Never Used  . Alcohol Use: Yes    Review of Systems  Constitutional: Negative for fever and chills.  HENT: Positive for neck pain.   Gastrointestinal: Negative for nausea, vomiting and abdominal pain.  Musculoskeletal: Positive for back pain.  All other systems reviewed and are negative.    Allergies  Review of patient's allergies indicates no known allergies.  Home Medications   Current Outpatient Rx  Name  Route  Sig  Dispense  Refill  . doxycycline  (VIBRAMYCIN) 100 MG capsule   Oral   Take 1 capsule (100 mg total) by mouth 2 (two) times daily.   20 capsule   0   . ibuprofen (ADVIL,MOTRIN) 200 MG tablet   Oral   Take 400 mg by mouth every 6 (six) hours as needed. Pain         . ibuprofen (ADVIL,MOTRIN) 800 MG tablet   Oral   Take 1 tablet (800 mg total) by mouth every 8 (eight) hours as needed for pain.   21 tablet   0    BP 114/76  Pulse 85  Temp(Src) 98.7 F (37.1 C) (Oral)  Resp 18  SpO2 94%  Physical Exam  Nursing note and vitals reviewed. Constitutional: He is oriented to person, place, and time. He appears well-developed and well-nourished. No distress.  HENT:  Head: Normocephalic and atraumatic.  Right Ear: External ear normal.  Left Ear: External ear normal.  Nose: Nose normal.  Eyes: Conjunctivae are normal.  Neck: Normal range of motion. No tracheal deviation present.  Cardiovascular: Normal rate, regular rhythm, normal heart sounds, intact distal pulses and normal pulses.   No murmur heard. Pulmonary/Chest: Effort normal and breath sounds normal. No stridor. No respiratory distress. He has no wheezes. He has no rales.  Abdominal: Soft. He exhibits no distension. There is no tenderness.  Musculoskeletal: Normal range of motion.  Back:  Tender to palpation in the right lower back and left neck. No bony tenderness.   Neurological: He is alert and oriented to person, place, and time.  Skin: Skin is warm and dry. He is not diaphoretic.  Psychiatric: He has a normal mood and affect. His behavior is normal.    ED Course   Procedures (including critical care time)  DIAGNOSTIC STUDIES: Oxygen Saturation is 94% on RA, adequate by my interpretation.    COORDINATION OF CARE: 5:35 PM-Discussed treatment plan which includes pain medication and a muscle relaxer with pt at bedside and pt agreed to plan.   Labs Reviewed - No data to display No results found. 1. Back pain     MDM  Patient with back  pain.  No neurological deficits and normal neuro exam.  Patient can walk but states is painful.  No loss of bowel or bladder control.  No concern for cauda equina.  No fever, night sweats, weight loss, h/o cancer, IVDU.  RICE protocol and pain medicine indicated and discussed with patient.       I personally performed the services described in this documentation, which was scribed in my presence. The recorded information has been reviewed and is accurate.    Mora Bellman, PA-C 04/22/13 2109

## 2013-04-25 NOTE — ED Provider Notes (Signed)
Medical screening examination/treatment/procedure(s) were performed by non-physician practitioner and as supervising physician I was immediately available for consultation/collaboration.   Timothy Macioce L Pleasant Bensinger, MD 04/25/13 1202 

## 2014-01-29 ENCOUNTER — Ambulatory Visit: Payer: Self-pay | Admitting: Podiatry

## 2014-02-06 ENCOUNTER — Ambulatory Visit: Payer: Self-pay | Admitting: Podiatry

## 2014-09-05 DIAGNOSIS — B159 Hepatitis A without hepatic coma: Secondary | ICD-10-CM

## 2014-09-05 HISTORY — DX: Hepatitis a without hepatic coma: B15.9

## 2014-10-05 ENCOUNTER — Encounter (HOSPITAL_COMMUNITY): Payer: Self-pay | Admitting: Emergency Medicine

## 2014-10-05 ENCOUNTER — Emergency Department (HOSPITAL_COMMUNITY)
Admission: EM | Admit: 2014-10-05 | Discharge: 2014-10-05 | Disposition: A | Discharge status: Home / Self Care | Payer: BC Managed Care – PPO | Attending: Emergency Medicine | Admitting: Emergency Medicine

## 2014-10-05 DIAGNOSIS — R0789 Other chest pain: Secondary | ICD-10-CM | POA: Insufficient documentation

## 2014-10-05 DIAGNOSIS — R079 Chest pain, unspecified: Secondary | ICD-10-CM

## 2014-10-05 DIAGNOSIS — G8929 Other chronic pain: Secondary | ICD-10-CM | POA: Diagnosis not present

## 2014-10-05 DIAGNOSIS — Z720 Tobacco use: Secondary | ICD-10-CM | POA: Diagnosis not present

## 2014-10-05 DIAGNOSIS — F1410 Cocaine abuse, uncomplicated: Secondary | ICD-10-CM | POA: Diagnosis not present

## 2014-10-05 DIAGNOSIS — Z87442 Personal history of urinary calculi: Secondary | ICD-10-CM | POA: Diagnosis not present

## 2014-10-05 DIAGNOSIS — Z8739 Personal history of other diseases of the musculoskeletal system and connective tissue: Secondary | ICD-10-CM | POA: Diagnosis not present

## 2014-10-05 LAB — CBC WITH DIFFERENTIAL/PLATELET
BASOS ABS: 0 10*3/uL (ref 0.0–0.1)
BASOS PCT: 0 % (ref 0–1)
EOS PCT: 3 % (ref 0–5)
Eosinophils Absolute: 0.2 10*3/uL (ref 0.0–0.7)
HCT: 41 % (ref 39.0–52.0)
Hemoglobin: 14.1 g/dL (ref 13.0–17.0)
Lymphocytes Relative: 33 % (ref 12–46)
Lymphs Abs: 2.9 10*3/uL (ref 0.7–4.0)
MCH: 30.6 pg (ref 26.0–34.0)
MCHC: 34.4 g/dL (ref 30.0–36.0)
MCV: 88.9 fL (ref 78.0–100.0)
MONO ABS: 0.6 10*3/uL (ref 0.1–1.0)
MONOS PCT: 7 % (ref 3–12)
Neutro Abs: 5.1 10*3/uL (ref 1.7–7.7)
Neutrophils Relative %: 57 % (ref 43–77)
PLATELETS: 235 10*3/uL (ref 150–400)
RBC: 4.61 MIL/uL (ref 4.22–5.81)
RDW: 14.3 % (ref 11.5–15.5)
WBC: 8.8 10*3/uL (ref 4.0–10.5)

## 2014-10-05 LAB — COMPREHENSIVE METABOLIC PANEL
ALBUMIN: 3.8 g/dL (ref 3.5–5.2)
ALK PHOS: 55 U/L (ref 39–117)
ALT: 18 U/L (ref 0–53)
AST: 20 U/L (ref 0–37)
Anion gap: 7 (ref 5–15)
BUN: 15 mg/dL (ref 6–23)
CALCIUM: 8.8 mg/dL (ref 8.4–10.5)
CHLORIDE: 99 mmol/L (ref 96–112)
CO2: 28 mmol/L (ref 19–32)
Creatinine, Ser: 0.95 mg/dL (ref 0.50–1.35)
GFR calc non Af Amer: >90 mL/min (ref >90)
Glucose, Bld: 100 mg/dL — ABNORMAL HIGH (ref 70–99)
Potassium: 3.2 mmol/L — ABNORMAL LOW (ref 3.5–5.1)
Sodium: 134 mmol/L — ABNORMAL LOW (ref 135–145)
TOTAL PROTEIN: 6.7 g/dL (ref 6.0–8.3)
Total Bilirubin: 0.6 mg/dL (ref 0.3–1.2)

## 2014-10-05 LAB — I-STAT TROPONIN, ED: TROPONIN I, POC: 0 ng/mL (ref 0.00–0.08)

## 2014-10-05 LAB — TROPONIN I

## 2014-10-05 MED ORDER — NAPROXEN 500 MG PO TABS: 500.0000 mg | ORAL_TABLET | Freq: Two times a day (BID) | ORAL | Status: DC

## 2014-10-05 MED ORDER — ONDANSETRON 8 MG PO TBDP
8.0000 mg | ORAL_TABLET | Freq: Once | ORAL | Status: AC
  Administered 2014-10-05: 8 mg via ORAL
  Filled 2014-10-05: qty 1

## 2014-10-05 NOTE — ED Notes (Addendum)
Awake. Verbally responsive. A/O x4. Resp even and unlabored. No audible adventitious breath sounds noted. ABC's intact.  

## 2014-10-05 NOTE — ED Provider Notes (Signed)
  Physical Exam  BP 120/56 mmHg  Pulse 84  Temp(Src) 98.4 F (36.9 C) (Oral)  Resp 14  SpO2 95%  Patient received in sign out from PA upstill. Patient seen for complaint of chest pain after using cocaine. 2 negative troponins and unremarkable EKGs. The patient has no central chest pain, however, is complaining of soreness of the chest wall. 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  Physical Exam  Constitutional: He appears well-developed and well-nourished. No distress.  HENT:  Head: Normocephalic and atraumatic.  Eyes: Conjunctivae are normal. No scleral icterus.  Neck: Normal range of motion. Neck supple.  Cardiovascular: Normal rate, regular rhythm and normal heart sounds.   Pulmonary/Chest: Effort normal and breath sounds normal. No respiratory distress. He exhibits tenderness.    Abdominal: Soft. There is no tenderness.  Musculoskeletal: He exhibits no edema.  Neurological: He is alert.  Skin: Skin is warm and dry. He is not diaphoretic.  Psychiatric: His behavior is normal.  Nursing note and vitals reviewed.   ED Course  Procedures  MDM 9:48 AM BP 120/56 mmHg  Pulse 84  Temp(Src) 98.4 F (36.9 C) (Oral)  Resp 14  SpO2 95% Patient with 2 negative troponins and EKGs unremarkable. He has mild hypokalemia. Patient will be given potassium prior to discharge.  Patient will also be given anti-inflammatories for muscle pain. He appears safe for discharge at this time per her plan.      Margarita Mail, PA-C 10/05/14 [phone]

## 2014-10-05 NOTE — ED Notes (Signed)
Pt states that "I have been using cocaine all day. I relapsed." Pt reports ShOB with CP, after use. Pt noted to be trembling, initially acted as if he was unable to speak, but after waiting, pt gave all responses.

## 2014-10-05 NOTE — ED Provider Notes (Signed)
CSN: [id number]     Arrival date & time 10/05/14  8119 History   First MD Initiated Contact with Patient 10/05/14 [phone]     Chief Complaint  Patient presents with  . Addiction Problem     (Consider location/radiation/quality/duration/timing/severity/associated sxs/prior Treatment) Patient is a 54 y.o. male presenting with chest pain. The history is provided by the patient. No language interpreter was used.  Chest Pain Pain location:  Substernal area Pain quality: sharp   Pain radiates to:  Does not radiate Pain radiates to the back: no   Pain severity:  Moderate Onset quality:  Gradual Timing:  Constant Progression:  Improving Chronicity:  New Associated symptoms: no fever   Associated symptoms comment:  He presents to the ED with complaint of sharp, substernal chest pain that started yesterday around 2 in the afternoon after using cocaine for the first time "in a while" after relapsing. It was associated with SOB. He states the pain has been constant so he drove himself to the ED for further evaluation. The pain persists but is better than at onset.    Past Medical History  Diagnosis Date  . Kidney stones   . Chronic back pain   . Memory loss   . Knee pain    Past Surgical History  Procedure Laterality Date  . Tonsillectomy     Family History  Problem Relation Age of Onset  . Alcohol abuse Mother   . Stroke Father   . Heart disease Father   . Diabetes Father   . Hypertension Father    History  Substance Use Topics  . Smoking status: Current Every Day Smoker -- 1.00 packs/day    Types: Cigarettes  . Smokeless tobacco: Never Used  . Alcohol Use: Yes    Review of Systems  Constitutional: Negative for fever and chills.  HENT: Negative.   Respiratory: Negative.   Cardiovascular: Positive for chest pain.  Gastrointestinal: Negative.   Musculoskeletal: Negative.   Skin: Negative.   Neurological: Negative.       Allergies  Review of patient's allergies  indicates no known allergies.  Home Medications   Prior to Admission medications   Medication Sig Start Date End Date Taking? Authorizing Provider  ibuprofen (ADVIL,MOTRIN) 200 MG tablet Take 400 mg by mouth every 6 (six) hours as needed. Pain   Yes Historical Provider, MD  diazepam (VALIUM) 5 MG tablet Take 1 tablet (5 mg total) by mouth 2 (two) times daily. Patient not taking: Reported on 10/05/2014 04/22/13   Elwyn Lade, PA-C  HYDROcodone-acetaminophen (NORCO/VICODIN) 5-325 MG per tablet Take 2 tablets by mouth every 6 (six) hours as needed for pain. Patient not taking: Reported on 10/05/2014 04/22/13   Elwyn Lade, PA-C  promethazine (PHENERGAN) 25 MG tablet Take 1 tablet (25 mg total) by mouth every 6 (six) hours as needed for nausea. Patient not taking: Reported on 10/05/2014 04/22/13   Kara Mead Merrell, PA-C   BP 168/74 mmHg  Pulse 102  Temp(Src) 97.5 F (36.4 C) (Oral)  Resp 28  SpO2 99% Physical Exam  Constitutional: He is oriented to person, place, and time. He appears well-developed and well-nourished. No distress.  HENT:  Head: Normocephalic.  Neck: Normal range of motion.  Cardiovascular: Normal rate.   No murmur heard. Pulmonary/Chest: Effort normal. He has no wheezes. He has no rales.  Abdominal: Soft. There is no tenderness. There is no rebound and no guarding.  Musculoskeletal: Normal range of motion. He exhibits no edema.  Neurological:  He is alert and oriented to person, place, and time.  Skin: Skin is warm and dry.  Psychiatric: He has a normal mood and affect.    ED Course  Procedures (including critical care time) Labs Review Labs Reviewed  TROPONIN I  CBC WITH DIFFERENTIAL/PLATELET  COMPREHENSIVE METABOLIC PANEL  TROPONIN I    Imaging Review No results found.   EKG Interpretation   Date/Time:  Sunday October 05 2014 04:00:23 EST Ventricular Rate:  92 PR Interval:  130 QRS Duration: 93 QT Interval:  355 QTC Calculation: 439 R Axis:    -37 Text Interpretation:  Sinus rhythm Probable left atrial enlargement Left  axis deviation RSR' in V1 or V2, right VCD or RVH Baseline wander in  lead(s) V4 Confirmed by Glynn Octave [phone]) on 10/05/2014 5:52:34  AM      MDM   Final diagnoses:  None    1. Chest pain 2. Cocaine dependence  No EKG changes with atypical chest pain. Will plan to do a delta troponin. If he is cleared from a cardiac standpoint, he is seeking outpatient resources for cocaine addiction. He is currently comfortable, sleeping on initial examination.  Patient care transferred to Doreen Salvage for recheck of lab studies and reassessment of patient.    Dewaine Oats, PA-C 10/05/14 6333  Everlene Balls, MD 10/05/14 [phone]

## 2014-10-05 NOTE — ED Notes (Signed)
Pt alert,oriented, and ambulatory upon DC. 

## 2014-10-05 NOTE — ED Notes (Signed)
Pt resting comfortably. He reports that he has 5/10 right and central chest soreness. NSR in 80's in cardiac monitor.

## 2014-10-05 NOTE — Discharge Instructions (Signed)
Your caregiver has diagnosed you as having chest pain that is not specific for one problem, but does not require admission.  You are at low risk for an acute heart condition or other serious illness. Chest pain comes from many different causes.  SEEK IMMEDIATE MEDICAL ATTENTION IF: You have severe chest pain, especially if the pain is crushing or pressure-like and spreads to the arms, back, neck, or jaw, or if you have sweating, nausea (feeling sick to your stomach), or shortness of breath. THIS IS AN EMERGENCY. Don't wait to see if the pain will go away. Get medical help at once. Call 911 or 0 (operator). DO NOT drive yourself to the hospital.  Your chest pain gets worse and does not go away with rest.  You have an attack of chest pain lasting longer than usual, despite rest and treatment with the medications your caregiver has prescribed.  You wake from sleep with chest pain or shortness of breath.  You feel dizzy or faint.  You have chest pain not typical of your usual pain for which you originally saw your caregiver.   Chest Wall Pain Chest wall pain is pain in or around the bones and muscles of your chest. It may take up to 6 weeks to get better. It may take longer if you must stay physically active in your work and activities.  CAUSES  Chest wall pain may happen on its own. However, it may be caused by:  A viral illness like the flu.  Injury.  Coughing.  Exercise.  Arthritis.  Fibromyalgia.  Shingles. HOME CARE INSTRUCTIONS   Avoid overtiring physical activity. Try not to strain or perform activities that cause pain. This includes any activities using your chest or your abdominal and side muscles, especially if heavy weights are used.  Put ice on the sore area.  Put ice in a plastic bag.  Place a towel between your skin and the bag.  Leave the ice on for 15-20 minutes per hour while awake for the first 2 days.  Only take over-the-counter or prescription medicines for  pain, discomfort, or fever as directed by your caregiver. SEEK IMMEDIATE MEDICAL CARE IF:   Your pain increases, or you are very uncomfortable.  You have a fever.  Your chest pain becomes worse.  You have new, unexplained symptoms.  You have nausea or vomiting.  You feel sweaty or lightheaded.  You have a cough with phlegm (sputum), or you cough up blood. MAKE SURE YOU:   Understand these instructions.  Will watch your condition.  Will get help right away if you are not doing well or get worse. Document Released: 08/22/2005 Document Revised: 11/14/2011 Document Reviewed: 04/18/2011 [facility] Patient Information 2015 [place], [place]. This information is not intended to replace advice given to you by your health care provider. Make sure you discuss any questions you have with your health care provider.  Stimulant Use Disorder-Cocaine Cocaine is one of a group of powerful drugs called stimulants. Cocaine has medical uses for stopping nosebleeds and for pain control before minor nose or dental surgery. However, cocaine is misused because of the effects that it produces. These effects include:   A feeling of extreme pleasure.  Alertness.  High energy. Common street names for cocaine include coke, crack, blow, snow, and nose candy. Cocaine is snorted, dissolved in water and injected, or smoked.  Stimulants are addictive because they activate regions of the brain that produce both the pleasurable sensation of "reward" and psychological dependence. Together,  these actions account for loss of control and the rapid development of drug dependence. This means you become ill without the drug (withdrawal) and need to keep using it to function.  Stimulant use disorder is use of stimulants that disrupts your daily life. It disrupts relationships with family and friends and how you do your job. Cocaine increases your blood pressure and heart rate. It can cause a heart attack or stroke. Cocaine can  also cause death from irregular heart rate or seizures. SYMPTOMS Symptoms of stimulant use disorder with cocaine include:  Use of cocaine in larger amounts or over a longer period of time than intended.  Unsuccessful attempts to cut down or control cocaine use.  A lot of time spent obtaining, using, or recovering from the effects of cocaine.  A strong desire or urge to use cocaine (craving).  Continued use of cocaine in spite of major problems at work, school, or home because of use.  Continued use of cocaine in spite of relationship problems because of use.  Giving up or cutting down on important life activities because of cocaine use.  Use of cocaine over and over in situations when it is physically hazardous, such as driving a car.  Continued use of cocaine in spite of a physical problem that is likely related to use. Physical problems can include:  Malnutrition.  Nosebleeds.  Chest pain.  High blood pressure.  A hole that develops between the part of your nose that separates your nostrils (perforated nasal septum).  Lung and kidney damage.  Continued use of cocaine in spite of a mental problem that is likely related to use. Mental problems can include:  Schizophrenia-like symptoms.  Depression.  Bipolar mood swings.  Anxiety.  Sleep problems.  Need to use more and more cocaine to get the same effect, or lessened effect over time with use of the same amount of cocaine (tolerance).  Having withdrawal symptoms when cocaine use is stopped, or using cocaine to reduce or avoid withdrawal symptoms. Withdrawal symptoms include:  Depressed or irritable mood.  Low energy or restlessness.  Bad dreams.  Poor or excessive sleep.  Increased appetite. DIAGNOSIS Stimulant use disorder is diagnosed by your health care provider. You may be asked questions about your cocaine use and how it affects your life. A physical exam may be done. A drug screen may be ordered. You  may be referred to a mental health professional. The diagnosis of stimulant use disorder requires at least two symptoms within 12 months. The type of stimulant use disorder depends on the number of signs and symptoms you have. The type may be:  Mild. Two or three signs and symptoms.  Moderate. Four or five signs and symptoms.  Severe. Six or more signs and symptoms. TREATMENT Treatment for stimulant use disorder is usually provided by mental health professionals with training in substance use disorders. The following options are available:  Counseling or talk therapy. Talk therapy addresses the reasons you use cocaine and ways to keep you from using again. Goals of talk therapy include:  Identifying and avoiding triggers for use.  Handling cravings.  Replacing use with healthy activities.  Support groups. Support groups provide emotional support, advice, and guidance.  Medicine. Certain medicines may decrease cocaine cravings or withdrawal symptoms. HOME CARE INSTRUCTIONS  Take medicines only as directed by your health care provider.  Identify the people and activities that trigger your cocaine use and avoid them.  Keep all follow-up visits as directed by your health  care provider. SEEK MEDICAL CARE IF:  Your symptoms get worse or you relapse.  You are not able to take medicines as directed. SEEK IMMEDIATE MEDICAL CARE IF:  You have serious thoughts about hurting yourself or others.  You have a seizure, chest pain, sudden weakness, or loss of speech or vision. [place] on Drug Abuse: motorcyclefax.com  Substance Abuse and Mental Health Services Administration: ktimeonline.com Document Released: 08/19/2000 Document Revised: 01/06/2014 Document Reviewed: 09/04/2013 [facility] Patient Information 2015 [place], [place]. This information is not intended to replace advice given to you by your health care provider. Make sure you discuss any questions  you have with your health care provider.

## 2015-04-13 ENCOUNTER — Emergency Department (HOSPITAL_BASED_OUTPATIENT_CLINIC_OR_DEPARTMENT_OTHER)
Admission: EM | Admit: 2015-04-13 | Discharge: 2015-04-13 | Disposition: A | Discharge status: Home / Self Care | Payer: BLUE CROSS/BLUE SHIELD | Attending: Emergency Medicine | Admitting: Emergency Medicine

## 2015-04-13 ENCOUNTER — Emergency Department (HOSPITAL_BASED_OUTPATIENT_CLINIC_OR_DEPARTMENT_OTHER): Payer: BLUE CROSS/BLUE SHIELD

## 2015-04-13 ENCOUNTER — Encounter (HOSPITAL_BASED_OUTPATIENT_CLINIC_OR_DEPARTMENT_OTHER): Payer: Self-pay

## 2015-04-13 DIAGNOSIS — T148XXA Other injury of unspecified body region, initial encounter: Secondary | ICD-10-CM

## 2015-04-13 DIAGNOSIS — R109 Unspecified abdominal pain: Secondary | ICD-10-CM

## 2015-04-13 DIAGNOSIS — G8929 Other chronic pain: Secondary | ICD-10-CM | POA: Insufficient documentation

## 2015-04-13 DIAGNOSIS — Z87442 Personal history of urinary calculi: Secondary | ICD-10-CM | POA: Insufficient documentation

## 2015-04-13 DIAGNOSIS — Y999 Unspecified external cause status: Secondary | ICD-10-CM | POA: Insufficient documentation

## 2015-04-13 DIAGNOSIS — X58XXXA Exposure to other specified factors, initial encounter: Secondary | ICD-10-CM | POA: Insufficient documentation

## 2015-04-13 DIAGNOSIS — S3992XA Unspecified injury of lower back, initial encounter: Secondary | ICD-10-CM | POA: Insufficient documentation

## 2015-04-13 DIAGNOSIS — Y929 Unspecified place or not applicable: Secondary | ICD-10-CM | POA: Insufficient documentation

## 2015-04-13 DIAGNOSIS — Z720 Tobacco use: Secondary | ICD-10-CM | POA: Insufficient documentation

## 2015-04-13 DIAGNOSIS — Y939 Activity, unspecified: Secondary | ICD-10-CM | POA: Insufficient documentation

## 2015-04-13 DIAGNOSIS — T148 Other injury of unspecified body region: Secondary | ICD-10-CM | POA: Insufficient documentation

## 2015-04-13 HISTORY — DX: Other psychoactive substance abuse, uncomplicated: F19.10

## 2015-04-13 LAB — URINALYSIS, ROUTINE W REFLEX MICROSCOPIC
Bilirubin Urine: NEGATIVE
Glucose, UA: NEGATIVE mg/dL
HGB URINE DIPSTICK: NEGATIVE
KETONES UR: NEGATIVE mg/dL
Leukocytes, UA: NEGATIVE
NITRITE: NEGATIVE
Protein, ur: NEGATIVE mg/dL
Specific Gravity, Urine: 1.015 (ref 1.005–1.030)
Urobilinogen, UA: 1 mg/dL (ref 0.0–1.0)
pH: 6.5 (ref 5.0–8.0)

## 2015-04-13 MED ORDER — METHOCARBAMOL 500 MG PO TABS: 500.0000 mg | ORAL_TABLET | Freq: Three times a day (TID) | ORAL | Status: DC | PRN

## 2015-04-13 MED ORDER — CYCLOBENZAPRINE HCL 10 MG PO TABS
5.0000 mg | ORAL_TABLET | Freq: Once | ORAL | Status: AC
Start: 2015-04-13 — End: 2015-04-13
  Administered 2015-04-13: 5 mg via ORAL
  Filled 2015-04-13: qty 1

## 2015-04-13 NOTE — ED Notes (Signed)
Pt reports unable to void at this time-given CCUA for ED WR

## 2015-04-13 NOTE — ED Provider Notes (Signed)
CSN: [id number]     Arrival date & time 04/13/15  1553 History  This chart was scribed for Wandra Arthurs, MD by Meriel Pica, ED Scribe. This patient was seen in room MH03/MH03 and the patient's care was started 4:39 PM.   Chief Complaint  Patient presents with  . Flank Pain   The history is provided by the patient. No language interpreter was used.   HPI Comments: Timothy Melton is a 54 y.o. male, with a PMhx of renal calculi, who presents to the Emergency Department complaining of constant, moderate left-sided flank pain onset 3 days ago. He reports the pain to radiate to his lower back. Pt additionally notes his pain is exacerbated after meals. He has taken ibuprofen with mild to no relief. Last normal BM was 1 day ago. Pt states he has been hydrating appropriately. Pt reports his current pain feels like past pain experienced with kidney stones. 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 Denies fevers, dysuria, nausea, or vomiting.    Past Medical History  Diagnosis Date  . Kidney stones   . Chronic back pain   . Memory loss   . Knee pain   . Substance abuse    Past Surgical History  Procedure Laterality Date  . Tonsillectomy     Family History  Problem Relation Age of Onset  . Alcohol abuse Mother   . Stroke Father   . Heart disease Father   . Diabetes Father   . Hypertension Father    History  Substance Use Topics  . Smoking status: Current Every Day Smoker -- 1.00 packs/day    Types: Cigarettes  . Smokeless tobacco: Never Used  . Alcohol Use: No     Comment: in rehab    Review of Systems  Constitutional: Negative for fever.  Gastrointestinal: Negative for nausea and vomiting.  Genitourinary: Positive for flank pain. Negative for dysuria.  Musculoskeletal: Positive for back pain.  All other systems reviewed and are negative.  Allergies  Review of patient's allergies indicates no known allergies.  Home Medications   Prior  to Admission medications   Medication Sig Start Date End Date Taking? Authorizing Provider  MELOXICAM PO Take by mouth.   Yes Historical Provider, MD  ibuprofen (ADVIL,MOTRIN) 200 MG tablet Take 400 mg by mouth every 6 (six) hours as needed. Pain    Historical Provider, MD   BP 129/82 mmHg  Pulse 92  Temp(Src) 98.2 F (36.8 C) (Oral)  Resp 16  Ht 5\' 5"  (1.651 m)  Wt 160 lb (72.576 kg)  BMI 26.63 kg/m2  SpO2 100% Physical Exam  Constitutional: He appears well-developed and well-nourished. No distress.  HENT:  Head: Normocephalic.  Eyes: Conjunctivae are normal. Right eye exhibits no discharge. Left eye exhibits no discharge. No scleral icterus.  Neck: No JVD present.  Cardiovascular: Normal rate, regular rhythm and normal heart sounds.   Pulmonary/Chest: Effort normal and breath sounds normal. No respiratory distress. He has no wheezes.  Abdominal: Soft. Bowel sounds are normal. There is no tenderness.  Mild L CVA tenderness.   Musculoskeletal: He exhibits tenderness.  Left para lumbar tenderness.   Neurological: He is alert. Coordination normal.  Skin: Skin is warm. No rash noted. No erythema. No pallor.  Psychiatric: He has a normal mood and affect. His behavior is normal.  Nursing note and vitals reviewed.  ED Course  Procedures  DIAGNOSTIC STUDIES: Oxygen Saturation is 100% on RA, normal by  my interpretation.    COORDINATION OF CARE: 4:43 PM Discussed treatment plan which includes to order flexeril and CT renal stone study with pt. Will also order diagnostic labs. Pt acknowledges and agrees to plan.   Labs Review Labs Reviewed  URINALYSIS, ROUTINE W REFLEX MICROSCOPIC (NOT AT [facility])    Imaging Review Ct Renal Stone Study  04/13/2015   CLINICAL DATA:  54 year old male with left-sided flank pain for the past 3 days.  EXAM: CT ABDOMEN AND PELVIS WITHOUT CONTRAST  TECHNIQUE: Multidetector CT imaging of the abdomen and pelvis was performed following the standard protocol  without IV contrast.  COMPARISON:  No priors.  FINDINGS: Lower chest:  Unremarkable.  Hepatobiliary: Multiple well-defined low-attenuation lesions in the liver, incompletely characterized on today's noncontrast CT examination, but favored to represent multiple cysts. The largest of these measures 2 cm in segment 3. Gallbladder is unremarkable in appearance.  Pancreas: No definite pancreatic mass or peripancreatic inflammatory changes on today's noncontrast CT examination.  Spleen: Unremarkable.  Adrenals/Urinary Tract: Bilateral adrenal glands and bilateral kidneys are normal in appearance. No calcifications are identified within the collecting system of either kidney, along the course of either ureter, or within the lumen of the urinary bladder. No hydroureteronephrosis or perinephric stranding to indicate urinary tract obstruction at this time. Urinary bladder is normal in appearance.  Stomach/Bowel: Unenhanced appearance of the stomach is normal. No pathologic dilatation of small bowel or colon. Normal appendix. High density material within the appendix compatible with multiple small appendicoliths.  Vascular/Lymphatic: Atherosclerotic calcifications throughout the abdominal and pelvic vasculature, without evidence of aneurysm. No lymphadenopathy noted in the abdomen or pelvis on today's noncontrast CT examination.  Reproductive: Prostate gland and seminal vesicles are unremarkable in appearance.  Other: Bilateral vasectomy clips. Numerous pelvic phleboliths. No significant volume of ascites. No pneumoperitoneum.  Musculoskeletal: There are no aggressive appearing lytic or blastic lesions noted in the visualized portions of the skeleton.  IMPRESSION: 1. No acute findings in the abdomen or pelvis to account for the patient's symptoms. Specifically, no urinary tract calculi and no findings of urinary tract obstruction. 2. Normal appendix. Multiple small appendicoliths are present within the lumen, but there are no  surrounding inflammatory changes at this time. 3. Innumerable low-attenuation lesions in the liver, incompletely characterized, but favored to represent small cysts. 4. Atherosclerosis.   Electronically Signed   By: Vinnie Langton M.D.   On: 04/13/2015 17:34     EKG Interpretation None      MDM   Final diagnoses:  Acute left flank pain   Timothy Melton is a 54 y.o. male here with L flank pain. States that he had kidney stones in the past but there were no imaging previously and doesn't see any blood in urine. Will get UA, renal stone CT.  5:58 PM UA nl. CT showed no stone. Some incidental findings but likely not contributing to his pain. Likely muscle strain. Will dc back with robaxin.   I personally performed the services described in this documentation, which was scribed in my presence. The recorded information has been reviewed and is accurate.    Wandra Arthurs, MD 04/13/15 Carollee Massed

## 2015-04-13 NOTE — ED Notes (Signed)
Left flank pain x 3 days-at [facility] for crack, heroin use

## 2015-04-13 NOTE — Discharge Instructions (Signed)
Take motrin 800 mg every 6 hrs for pain.  Take robaxin for muscle spasms.   Follow up with your doctor.   Return to ER if you have fever, severe pain, vomiting.

## 2015-04-13 NOTE — ED Notes (Signed)
Pt complains of flank pain. Hx of kidney stones and feel like that. Urine clear with no trace of blood

## 2015-10-03 ENCOUNTER — Encounter (HOSPITAL_COMMUNITY): Payer: Self-pay | Admitting: *Deleted

## 2015-10-03 ENCOUNTER — Inpatient Hospital Stay (HOSPITAL_COMMUNITY)
Admission: EM | Admit: 2015-10-03 | Discharge: 2015-10-06 | DRG: 442 | Disposition: A | Discharge status: Home / Self Care | Payer: Self-pay | Attending: Family Medicine | Admitting: Family Medicine

## 2015-10-03 ENCOUNTER — Emergency Department (HOSPITAL_COMMUNITY): Payer: Self-pay

## 2015-10-03 ENCOUNTER — Emergency Department (HOSPITAL_COMMUNITY): Payer: BLUE CROSS/BLUE SHIELD

## 2015-10-03 DIAGNOSIS — B179 Acute viral hepatitis, unspecified: Secondary | ICD-10-CM | POA: Diagnosis present

## 2015-10-03 DIAGNOSIS — E785 Hyperlipidemia, unspecified: Secondary | ICD-10-CM | POA: Diagnosis present

## 2015-10-03 DIAGNOSIS — K7200 Acute and subacute hepatic failure without coma: Principal | ICD-10-CM | POA: Diagnosis present

## 2015-10-03 DIAGNOSIS — F17210 Nicotine dependence, cigarettes, uncomplicated: Secondary | ICD-10-CM | POA: Diagnosis present

## 2015-10-03 DIAGNOSIS — E860 Dehydration: Secondary | ICD-10-CM | POA: Diagnosis present

## 2015-10-03 DIAGNOSIS — D696 Thrombocytopenia, unspecified: Secondary | ICD-10-CM | POA: Diagnosis present

## 2015-10-03 DIAGNOSIS — Z823 Family history of stroke: Secondary | ICD-10-CM

## 2015-10-03 DIAGNOSIS — Z833 Family history of diabetes mellitus: Secondary | ICD-10-CM

## 2015-10-03 DIAGNOSIS — N179 Acute kidney failure, unspecified: Secondary | ICD-10-CM | POA: Insufficient documentation

## 2015-10-03 DIAGNOSIS — D72819 Decreased white blood cell count, unspecified: Secondary | ICD-10-CM | POA: Diagnosis present

## 2015-10-03 DIAGNOSIS — Z8249 Family history of ischemic heart disease and other diseases of the circulatory system: Secondary | ICD-10-CM

## 2015-10-03 DIAGNOSIS — Z811 Family history of alcohol abuse and dependence: Secondary | ICD-10-CM

## 2015-10-03 LAB — CBC
HCT: 48.2 % (ref 39.0–52.0)
Hemoglobin: 17.1 g/dL — ABNORMAL HIGH (ref 13.0–17.0)
MCH: 30.6 pg (ref 26.0–34.0)
MCHC: 35.5 g/dL (ref 30.0–36.0)
MCV: 86.4 fL (ref 78.0–100.0)
Platelets: 80 10*3/uL — ABNORMAL LOW (ref 150–400)
RBC: 5.58 MIL/uL (ref 4.22–5.81)
RDW: 14.3 % (ref 11.5–15.5)
WBC: 3.5 10*3/uL — AB (ref 4.0–10.5)

## 2015-10-03 LAB — LIPASE, BLOOD: LIPASE: 16 U/L (ref 11–51)

## 2015-10-03 LAB — COMPREHENSIVE METABOLIC PANEL
ALT: 6882 U/L — AB (ref 17–63)
ANION GAP: 10 (ref 5–15)
AST: 3026 U/L — ABNORMAL HIGH (ref 15–41)
Albumin: 3.1 g/dL — ABNORMAL LOW (ref 3.5–5.0)
Alkaline Phosphatase: 103 U/L (ref 38–126)
BUN: 16 mg/dL (ref 6–20)
CHLORIDE: 98 mmol/L — AB (ref 101–111)
CO2: 24 mmol/L (ref 22–32)
Calcium: 8.9 mg/dL (ref 8.9–10.3)
Creatinine, Ser: 1.28 mg/dL — ABNORMAL HIGH (ref 0.61–1.24)
Glucose, Bld: 100 mg/dL — ABNORMAL HIGH (ref 65–99)
POTASSIUM: 4.6 mmol/L (ref 3.5–5.1)
SODIUM: 132 mmol/L — AB (ref 135–145)
Total Bilirubin: 3.6 mg/dL — ABNORMAL HIGH (ref 0.3–1.2)
Total Protein: 5.9 g/dL — ABNORMAL LOW (ref 6.5–8.1)

## 2015-10-03 LAB — URINALYSIS, ROUTINE W REFLEX MICROSCOPIC
Glucose, UA: NEGATIVE mg/dL
Hgb urine dipstick: NEGATIVE
KETONES UR: 15 mg/dL — AB
Leukocytes, UA: NEGATIVE
NITRITE: NEGATIVE
PH: 6.5 (ref 5.0–8.0)
Protein, ur: 30 mg/dL — AB
Specific Gravity, Urine: 1.044 — ABNORMAL HIGH (ref 1.005–1.030)

## 2015-10-03 LAB — RAPID URINE DRUG SCREEN, HOSP PERFORMED
AMPHETAMINES: NOT DETECTED
BENZODIAZEPINES: NOT DETECTED
Barbiturates: NOT DETECTED
COCAINE: NOT DETECTED
Opiates: NOT DETECTED
Tetrahydrocannabinol: NOT DETECTED

## 2015-10-03 LAB — URINE MICROSCOPIC-ADD ON
Bacteria, UA: NONE SEEN
RBC / HPF: NONE SEEN RBC/hpf (ref 0–5)

## 2015-10-03 LAB — CBC WITH DIFFERENTIAL/PLATELET
BASOS PCT: 1 %
Basophils Absolute: 0 10*3/uL (ref 0.0–0.1)
EOS ABS: 0.4 10*3/uL (ref 0.0–0.7)
EOS PCT: 9 %
HCT: 44.6 % (ref 39.0–52.0)
Hemoglobin: 15.4 g/dL (ref 13.0–17.0)
LYMPHS ABS: 1.1 10*3/uL (ref 0.7–4.0)
Lymphocytes Relative: 28 %
MCH: 30 pg (ref 26.0–34.0)
MCHC: 34.5 g/dL (ref 30.0–36.0)
MCV: 86.8 fL (ref 78.0–100.0)
MONOS PCT: 9 %
Monocytes Absolute: 0.3 10*3/uL (ref 0.1–1.0)
NEUTROS PCT: 53 %
Neutro Abs: 2.1 10*3/uL (ref 1.7–7.7)
PLATELETS: 86 10*3/uL — AB (ref 150–400)
RBC: 5.14 MIL/uL (ref 4.22–5.81)
RDW: 14.2 % (ref 11.5–15.5)
WBC: 3.9 10*3/uL — ABNORMAL LOW (ref 4.0–10.5)

## 2015-10-03 LAB — CK: CK TOTAL: 84 U/L (ref 49–397)

## 2015-10-03 LAB — ACETAMINOPHEN LEVEL

## 2015-10-03 LAB — PROTIME-INR
INR: 1.58 — ABNORMAL HIGH (ref 0.00–1.49)
PROTHROMBIN TIME: 18.9 s — AB (ref 11.6–15.2)

## 2015-10-03 LAB — SALICYLATE LEVEL

## 2015-10-03 MED ORDER — IOHEXOL 300 MG/ML  SOLN
100.0000 mL | Freq: Once | INTRAMUSCULAR | Status: AC | PRN
  Administered 2015-10-03: 100 mL via INTRAVENOUS

## 2015-10-03 MED ORDER — SODIUM CHLORIDE 0.9 % IV BOLUS (SEPSIS)
1000.0000 mL | Freq: Once | INTRAVENOUS | Status: AC
Start: 2015-10-03 — End: 2015-10-03
  Administered 2015-10-03: 1000 mL via INTRAVENOUS

## 2015-10-03 MED ORDER — SODIUM CHLORIDE 0.9 % IV SOLN
INTRAVENOUS | Status: AC
  Administered 2015-10-03 – 2015-10-04 (×2): via INTRAVENOUS

## 2015-10-03 MED ORDER — FENTANYL CITRATE (PF) 100 MCG/2ML IJ SOLN
50.0000 ug | Freq: Once | INTRAMUSCULAR | Status: AC
Start: 2015-10-03 — End: 2015-10-03
  Administered 2015-10-03: 50 ug via INTRAVENOUS
  Filled 2015-10-03: qty 2

## 2015-10-03 MED ORDER — ONDANSETRON 4 MG PO TBDP
ORAL_TABLET | ORAL | Status: AC
  Filled 2015-10-03: qty 1

## 2015-10-03 MED ORDER — ONDANSETRON HCL 4 MG/2ML IJ SOLN
4.0000 mg | Freq: Once | INTRAMUSCULAR | Status: AC
  Administered 2015-10-03: 4 mg via INTRAVENOUS
  Filled 2015-10-03: qty 2

## 2015-10-03 MED ORDER — FENTANYL CITRATE (PF) 100 MCG/2ML IJ SOLN: 25.0000 ug | INTRAMUSCULAR | Status: DC | PRN

## 2015-10-03 MED ORDER — ONDANSETRON 4 MG PO TBDP
4.0000 mg | ORAL_TABLET | Freq: Once | ORAL | Status: AC | PRN
  Administered 2015-10-03: 4 mg via ORAL

## 2015-10-03 MED ORDER — SODIUM CHLORIDE 0.9 % IV BOLUS (SEPSIS)
1000.0000 mL | Freq: Once | INTRAVENOUS | Status: AC
  Administered 2015-10-03: 1000 mL via INTRAVENOUS

## 2015-10-03 NOTE — H&P (Signed)
[facility] Admission History and Physical Service Pager: [phone]  Patient name: Timothy Melton Medical record number: [id number] Date of birth: Jan 09, 1961 Age: 55 y.o. Gender: male  Primary Care Provider: Jeb Levering, Philbert Riser, MD Consultants: None Code Status: Full  Chief Complaint: Nausea, vomiting, abdominal pain  Assessment and Plan: Timothy Melton is a 55 y.o. male presenting with 4 days of nausea, vomiting, and abdominal pain. PMH is significant for chronic knee and back pain. Found to have acute hepatitis on presentation.   Hepatitis. AST 3026 and ALT 6882 on admission. Total bilirubin 3.6. Differential includes viral hepatitis, ingestion, idiopathic, autoimmune, Budd-Chiari syndrome, biliary obstruction,  and malignancy. CT abdomen with multiple hepatic cysts and possibly acute cholecystitis. RUQ [place] with minal wall thickening but not cholelithiasis or other findings suggestive of acute cholecystitis. INR 1.58, MELD score 22. Acetaminophen and salicylate levels negative. Currently neurologically intact with no signs of encephalopathy.  - Admit to stepdown under attending Dr McDiarmid - Check UDS - Check acute hepatitis panel including HIV, CMV, and EBV - Consider checking ANA - Consider abdominal dopplers if above negative to rule out Budd-Chiari syndrome - Trend LFTs, INR - Frequent neuro checks - Check ammonia - Consider starting lactulose if shows signs of encephalopathy or has elevated ammonia - Fentanyl 12mcg q2hrs prn pain, though will be careful to not oversedate patient  Leukopenia / Thrombocytopenia. WBC 3.5 and platelets 80 on admission. Likely related to acute hepatic failure. - Trend CBC  AKI / Hyponatremia. Cr 1.28 on admission. Na 132. Likely secondary to dehyrdation due to nausea/vomiting.  - s/p 2L NS in ED - Avoid nephrotoxic medications - NS @150cc  for 12 hours - will re-evaluate fluid status in AM, anticipate switching  to maintenance fluids - Trend renal function, electrolytes  FEN/GI: NS @ 125cc/hr, NPO, ADAT Prophylaxis: SCDs. No anticoagulation due to coagulopathy.   Disposition: Admitted to stepdown under attending Dr McDiarmid pending above management.  History of Present Illness:  Timothy Melton is a 55 y.o. male presenting with nausea, vomiting and abdominal pain.  Five days ago he woke up with vomiting. This continued for the next several days. He tried eating toast and a tangerine but was not able to keep anything down. He also started having lower abdominal pain during this time. The pain is located in his periumbilical area and radiates to his right back. Pain is described as sometimes sharp and sometimes dull. Patient presented to the ED today because the pain had become unbearable. He has been taking "2-3 extra strength" aspirin every day for the last 6 months because "it helps your heart." No new medications. No ingestions of questionable foods or substances. Denies tylenol or acetaminophen ingestion. No sick contacts. Ate chicken and shrimp 7 days ago but has not eaten any raw seafood. Reports subjective fevers. Reports drinking around 6 alcoholic drinks per week. No personal or family history of blood clots.    Review Of Systems: Per HPI with the following additions: No fevers Otherwise the remainder of the systems were negative.  Patient Active Problem List   Diagnosis Date Noted  . Acute hepatic failure 10/03/2015  . General medical examination 02/12/2011  . Knee pain, left 02/12/2011  . Chronic back pain 02/12/2011  . ED (erectile dysfunction) 02/12/2011  . Kidney stones 02/12/2011  . Memory loss 02/12/2011  . Abdominal pain, acute, bilateral lower quadrant 02/12/2011    Past Medical History: Past Medical History  Diagnosis Date  . Kidney stones   .  Chronic back pain   . Memory loss   . Knee pain   . Substance abuse     Past Surgical History: Past Surgical History   Procedure Laterality Date  . Tonsillectomy      Social History: Social History  Substance Use Topics  . Smoking status: Current Every Day Smoker -- 1.00 packs/day    Types: Cigarettes  . Smokeless tobacco: Never Used  . Alcohol Use: Yes     Comment: occ   Additional social history: Does not use any drugs. Please also refer to relevant sections of EMR.  Family History: Family History  Problem Relation Age of Onset  . Alcohol abuse Mother   . Stroke Father   . Heart disease Father   . Diabetes Father   . Hypertension Father     Allergies and Medications: No Known Allergies No current facility-administered medications on file prior to encounter.   Current Outpatient Prescriptions on File Prior to Encounter  Medication Sig Dispense Refill  . methocarbamol (ROBAXIN) 500 MG tablet Take 1 tablet (500 mg total) by mouth every 8 (eight) hours as needed for muscle spasms. 20 tablet 0    Objective: BP 121/72 mmHg  Pulse 73  Temp(Src) 98.4 F (36.9 C) (Oral)  Resp 16  SpO2 100% Exam: General: 55 year old man sitting in hospital bed in NAD Eyes: scleral icterus noted. Pupils 2-12mm bilaterally, but reactive to light ENTM: Dry mucus membranes Neck: Supple Cardiovascular: RRR, No murmurs Respiratory: NWOB, CTAB Abdomen: +BS, Soft, tender in RUQ and RLQ. Non distended MSK: No edema or cyanosis Skin: warm, dry, no rashes Neuro: Alert and conversational, follows commands, CN2-12 intact, no asterixis, moves all extremities Psych: Appropriate mood and affect  Labs and Imaging: CBC BMET   Recent Labs Lab 10/03/15 2140  WBC 3.9*  HGB 15.4  HCT 44.6  PLT 86*    Recent Labs Lab 10/03/15 1504  NA 132*  K 4.6  CL 98*  CO2 24  BUN 16  CREATININE 1.28*  GLUCOSE 100*  CALCIUM 8.9    AST 3026 ALT 6882 Bilirubin 3.6  INR 123XX123 Salicylate <4 Acetaminophen <10  Ct Abdomen Pelvis W Contrast  10/03/2015  CLINICAL DATA:  Patient woke up on Tuesday with nausea,  vomiting, and abdominal pain. EXAM: CT ABDOMEN AND PELVIS WITH CONTRAST TECHNIQUE: Multidetector CT imaging of the abdomen and pelvis was performed using the standard protocol following bolus administration of intravenous contrast. CONTRAST:  189mL OMNIPAQUE IOHEXOL 300 MG/ML  SOLN COMPARISON:  04/13/2015 FINDINGS: Mild dependent changes in the lung bases. Multiple low-attenuation lesions demonstrated throughout the liver. These appear unchanged since previous study and likely represent multiple cysts. There is a small amount of pericholecystic fluid around the liver which is nonspecific but could indicate cholecystitis. No radiopaque stones are demonstrated. No bile duct dilatation. The pancreas, spleen, adrenal glands, kidneys, abdominal aorta, inferior vena cava, and retroperitoneal lymph nodes are unremarkable. Stomach, small bowel, and colon are not abnormally distended. Stool fills the colon. No free air or free fluid in the abdomen. Abdominal wall musculature appears intact. Pelvis: Prostate gland is mildly enlarged. Bladder wall is not thickened. No free or loculated pelvic fluid collections. No pelvic mass or lymphadenopathy. The appendix is normal. Degenerative changes in the spine. Degenerative changes in the hips. No destructive bone lesions. IMPRESSION: Multiple up attic cysts demonstrated. Small amount pericholecystic edema may indicate acute cholecystitis. No radiopaque stones are identified in the gallbladder. No bile duct dilatation. Mild prostate enlargement. Electronically  Signed   By: Lucienne Capers M.D.   On: 10/03/2015 19:39   US Abdomen Limited  10/03/2015  CLINICAL DATA:  Initial evaluation for possible acute cholecystitis. EXAM: US ABDOMEN LIMITED - RIGHT UPPER QUADRANT COMPARISON:  Prior CT from earlier the same day. FINDINGS: Gallbladder: No shadowing echogenic stones or sludge identified within the gallbladder lumen. Gallbladder wall minimally thickened to 4 mm. Trace free  pericholecystic fluid present. No sonographic Murphy sign elicited on exam. Common bile duct: Diameter: 5.2 mm. Liver: Within normal limits for echogenicity. Multiple anechoic cysts seen within the liver without internal vascularity. Largest of these was located in the left lobe and measured 1.8 x 2.0 x 2.0 cm. Thin avascular internal septation noted. Additional scattered cysts within the liver similar to previous CT. IMPRESSION: 1. Minimal gallbladder wall thickening up to 4 mm with trace pericholecystic free fluid. No cholelithiasis or other sonographic features to suggest acute cholecystitis. Finding may be related to the patient's overall volume status. No biliary dilatation. 2. Scattered hepatic cysts, better evaluated on CT performed earlier the same day. Electronically Signed   By: Jeannine Boga M.D.   On: 10/03/2015 21:14    Vivi Barrack, MD 10/03/2015, 10:48 PM PGY-2, [facility] pager: [phone], text pages welcome

## 2015-10-03 NOTE — ED Notes (Signed)
Called CT.  Stated 4 more pt's in front of pt.

## 2015-10-03 NOTE — ED Notes (Signed)
Pt still on [place].

## 2015-10-03 NOTE — ED Notes (Signed)
Admitting MD at the bedside.  

## 2015-10-03 NOTE — ED Notes (Signed)
Pt reports waking up on Tuesday with n/v and abd pain. Denies diarrhea, last BM was Monday.

## 2015-10-03 NOTE — ED Provider Notes (Signed)
CSN: [id number]     Arrival date & time 10/03/15  1447 History   First MD Initiated Contact with Patient 10/03/15 1558     Chief Complaint  Patient presents with  . Abdominal Pain  . Emesis     (Consider location/radiation/quality/duration/timing/severity/associated sxs/prior Treatment) HPI Patient presents with abdominal pain, decreased bowel movements, nausea, anorexia. Symptoms began 4 days ago. The day prior, the patient recalls eating barbecue. The day of symptom onset patient had diffuse severe abdominal pain, multiple episodes of vomiting. Since that time he has been intolerant of oral intake, has had persistent nausea, anorexia. No bowel movement in at least 3 days. No fever, confusion, chest pain, dyspnea. No relief with anything, and the patient has not been able to tolerate oral medication either. Patient denies history of abdominal surgery, acknowledges history of kidney stone.  Past Medical History  Diagnosis Date  . Kidney stones   . Chronic back pain   . Memory loss   . Knee pain   . Substance abuse    Past Surgical History  Procedure Laterality Date  . Tonsillectomy     Family History  Problem Relation Age of Onset  . Alcohol abuse Mother   . Stroke Father   . Heart disease Father   . Diabetes Father   . Hypertension Father    Social History  Substance Use Topics  . Smoking status: Current Every Day Smoker -- 1.00 packs/day    Types: Cigarettes  . Smokeless tobacco: Never Used  . Alcohol Use: Yes     Comment: occ    Review of Systems  Constitutional:       Per HPI, otherwise negative  HENT:       Per HPI, otherwise negative  Respiratory:       Per HPI, otherwise negative  Cardiovascular:       Per HPI, otherwise negative  Gastrointestinal: Positive for nausea, vomiting and abdominal pain. Negative for diarrhea.  Endocrine:       Negative aside from HPI  Genitourinary:       Neg aside from HPI   Musculoskeletal:       Per HPI, otherwise  negative  Skin: Negative.   Neurological: Negative for syncope.      Allergies  Review of patient's allergies indicates no known allergies.  Home Medications   Prior to Admission medications   Medication Sig Start Date End Date Taking? Authorizing Provider  ibuprofen (ADVIL,MOTRIN) 200 MG tablet Take 400 mg by mouth every 6 (six) hours as needed. Pain    Historical Provider, MD  MELOXICAM PO Take by mouth.    Historical Provider, MD  methocarbamol (ROBAXIN) 500 MG tablet Take 1 tablet (500 mg total) by mouth every 8 (eight) hours as needed for muscle spasms. 04/13/15   Wandra Arthurs, MD   BP 121/69 mmHg  Pulse 99  Temp(Src) 98.6 F (37 C) (Oral)  Resp 20  SpO2 100% Physical Exam  Constitutional: He is oriented to person, place, and time. He appears well-developed. No distress.  HENT:  Head: Normocephalic and atraumatic.  Eyes: Conjunctivae and EOM are normal.  Cardiovascular: Normal rate and regular rhythm.   Pulmonary/Chest: Effort normal. No stridor. No respiratory distress.  Abdominal: He exhibits no distension.  Diffusely tender abdomen, without focal findings, mild guarding  Musculoskeletal: He exhibits no edema.  Neurological: He is alert and oriented to person, place, and time.  Skin: Skin is warm and dry.  Psychiatric: He has a normal mood and  affect.  Nursing note and vitals reviewed.   ED Course  Procedures (including critical care time) Labs Review Labs Reviewed  COMPREHENSIVE METABOLIC PANEL - Abnormal; Notable for the following:    Sodium 132 (*)    Chloride 98 (*)    Glucose, Bld 100 (*)    Creatinine, Ser 1.28 (*)    Total Protein 5.9 (*)    Albumin 3.1 (*)    Total Bilirubin 3.6 (*)    All other components within normal limits  CBC - Abnormal; Notable for the following:    WBC 3.5 (*)    Hemoglobin 17.1 (*)    Platelets 80 (*)    All other components within normal limits  LIPASE, BLOOD  URINALYSIS, ROUTINE W REFLEX MICROSCOPIC (NOT AT [facility])     Imaging Review Ct Abdomen Pelvis W Contrast  10/03/2015  CLINICAL DATA:  Patient woke up on Tuesday with nausea, vomiting, and abdominal pain. EXAM: CT ABDOMEN AND PELVIS WITH CONTRAST TECHNIQUE: Multidetector CT imaging of the abdomen and pelvis was performed using the standard protocol following bolus administration of intravenous contrast. CONTRAST:  151mL OMNIPAQUE IOHEXOL 300 MG/ML  SOLN COMPARISON:  04/13/2015 FINDINGS: Mild dependent changes in the lung bases. Multiple low-attenuation lesions demonstrated throughout the liver. These appear unchanged since previous study and likely represent multiple cysts. There is a small amount of pericholecystic fluid around the liver which is nonspecific but could indicate cholecystitis. No radiopaque stones are demonstrated. No bile duct dilatation. The pancreas, spleen, adrenal glands, kidneys, abdominal aorta, inferior vena cava, and retroperitoneal lymph nodes are unremarkable. Stomach, small bowel, and colon are not abnormally distended. Stool fills the colon. No free air or free fluid in the abdomen. Abdominal wall musculature appears intact. Pelvis: Prostate gland is mildly enlarged. Bladder wall is not thickened. No free or loculated pelvic fluid collections. No pelvic mass or lymphadenopathy. The appendix is normal. Degenerative changes in the spine. Degenerative changes in the hips. No destructive bone lesions. IMPRESSION: Multiple up attic cysts demonstrated. Small amount pericholecystic edema may indicate acute cholecystitis. No radiopaque stones are identified in the gallbladder. No bile duct dilatation. Mild prostate enlargement. Electronically Signed   By: Lucienne Capers M.D.   On: 10/03/2015 19:39   US Abdomen Limited  10/03/2015  CLINICAL DATA:  Initial evaluation for possible acute cholecystitis. EXAM: US ABDOMEN LIMITED - RIGHT UPPER QUADRANT COMPARISON:  Prior CT from earlier the same day. FINDINGS: Gallbladder: No shadowing echogenic  stones or sludge identified within the gallbladder lumen. Gallbladder wall minimally thickened to 4 mm. Trace free pericholecystic fluid present. No sonographic Murphy sign elicited on exam. Common bile duct: Diameter: 5.2 mm. Liver: Within normal limits for echogenicity. Multiple anechoic cysts seen within the liver without internal vascularity. Largest of these was located in the left lobe and measured 1.8 x 2.0 x 2.0 cm. Thin avascular internal septation noted. Additional scattered cysts within the liver similar to previous CT. IMPRESSION: 1. Minimal gallbladder wall thickening up to 4 mm with trace pericholecystic free fluid. No cholelithiasis or other sonographic features to suggest acute cholecystitis. Finding may be related to the patient's overall volume status. No biliary dilatation. 2. Scattered hepatic cysts, better evaluated on CT performed earlier the same day. Electronically Signed   By: Jeannine Boga M.D.   On: 10/03/2015 21:14   I have personally reviewed and evaluated these images and lab results as part of my medical decision-making.  Chart review notable for no emergency department visits in several months.  After the initial evaluation the patient can receive IV fluids, analgesia, anti-emetic  Update: Initial evaluation concerning for hepatic failure, with AST, ALT, greater than 3000, 6000 respectively. I discussed patient's case with our hospitalist team for admission.  On repeat exam the patient is marginally better, appears calm. We discussed all findings at length. Patient reiterates, that he drinks only socially, has no recent drug use.   MDM  Patient presents with concern of ongoing nausea, anorexia, decreased bowel movements, and generalized abdominal discomfort. Patient is afebrile, with no leukocytosis, and though he has initial CT demonstrating small pericholecystic fluid, there is low suspicion for acute cholecystitis. However, the patient does have evidence  for acute liver failure, with elevated transaminase, hyperbilirubinemia. Patient felt somewhat better with Zofran, fluids, but with his new liver failure, he required admission for further evaluation, management.  Carmin Muskrat, MD 10/03/15 [phone]

## 2015-10-04 LAB — COMPREHENSIVE METABOLIC PANEL
ALBUMIN: 2.5 g/dL — AB (ref 3.5–5.0)
ALT: 1106 U/L — AB (ref 17–63)
AST: 994 U/L — AB (ref 15–41)
Alkaline Phosphatase: 87 U/L (ref 38–126)
Anion gap: 7 (ref 5–15)
BUN: 12 mg/dL (ref 6–20)
CHLORIDE: 102 mmol/L (ref 101–111)
CO2: 26 mmol/L (ref 22–32)
CREATININE: 1.06 mg/dL (ref 0.61–1.24)
Calcium: 7.8 mg/dL — ABNORMAL LOW (ref 8.9–10.3)
GFR calc Af Amer: >60 mL/min (ref >60)
GFR calc non Af Amer: >60 mL/min (ref >60)
GLUCOSE: 77 mg/dL (ref 65–99)
POTASSIUM: 4 mmol/L (ref 3.5–5.1)
Sodium: 135 mmol/L (ref 135–145)
Total Bilirubin: 3.3 mg/dL — ABNORMAL HIGH (ref 0.3–1.2)
Total Protein: 4.8 g/dL — ABNORMAL LOW (ref 6.5–8.1)

## 2015-10-04 LAB — CBC
HEMATOCRIT: 41.9 % (ref 39.0–52.0)
HEMOGLOBIN: 14.3 g/dL (ref 13.0–17.0)
MCH: 29.5 pg (ref 26.0–34.0)
MCHC: 34.1 g/dL (ref 30.0–36.0)
MCV: 86.4 fL (ref 78.0–100.0)
PLATELETS: 106 10*3/uL — AB (ref 150–400)
RBC: 4.85 MIL/uL (ref 4.22–5.81)
RDW: 14.1 % (ref 11.5–15.5)
WBC: 3.9 10*3/uL — AB (ref 4.0–10.5)

## 2015-10-04 LAB — BILIRUBIN, FRACTIONATED(TOT/DIR/INDIR)
BILIRUBIN DIRECT: 1.5 mg/dL — AB (ref 0.1–0.5)
BILIRUBIN INDIRECT: 1.5 mg/dL — AB (ref 0.3–0.9)
BILIRUBIN TOTAL: 3 mg/dL — AB (ref 0.3–1.2)
Bilirubin, Direct: 1.6 mg/dL — ABNORMAL HIGH (ref 0.1–0.5)
Indirect Bilirubin: 1.5 mg/dL — ABNORMAL HIGH (ref 0.3–0.9)
Total Bilirubin: 3.1 mg/dL — ABNORMAL HIGH (ref 0.3–1.2)

## 2015-10-04 LAB — AMMONIA: Ammonia: 52 umol/L — ABNORMAL HIGH (ref 9–35)

## 2015-10-04 LAB — APTT: aPTT: 28 seconds (ref 24–37)

## 2015-10-04 LAB — HIV ANTIBODY (ROUTINE TESTING W REFLEX): HIV Screen 4th Generation wRfx: NONREACTIVE

## 2015-10-04 LAB — LACTATE DEHYDROGENASE: LDH: 253 U/L — AB (ref 98–192)

## 2015-10-04 LAB — PROTIME-INR
INR: 1.53 — ABNORMAL HIGH (ref 0.00–1.49)
Prothrombin Time: 18.4 seconds — ABNORMAL HIGH (ref 11.6–15.2)

## 2015-10-04 LAB — ETHANOL

## 2015-10-04 MED ORDER — NICOTINE POLACRILEX 2 MG MT GUM
2.0000 mg | CHEWING_GUM | OROMUCOSAL | Status: DC | PRN
Start: 2015-10-04 — End: 2015-10-06
  Filled 2015-10-04: qty 1

## 2015-10-04 MED ORDER — NICOTINE 21 MG/24HR TD PT24
21.0000 mg | MEDICATED_PATCH | Freq: Every day | TRANSDERMAL | Status: DC
  Administered 2015-10-04 – 2015-10-05 (×2): 21 mg via TRANSDERMAL
  Filled 2015-10-04 (×2): qty 1

## 2015-10-04 NOTE — Progress Notes (Signed)
Family Medicine Teaching Service Daily Progress Note Intern Pager: [phone]  Patient name: Timothy Melton Medical record number: [id number] Date of birth: 10/09/1960 Age: 55 y.o. Gender: male  Primary Care Provider: Jeb Levering, Philbert Riser, MD Consultants: none Code Status: full  Pt Overview and Major Events to Date:  1/29-admitted with acute hepatitis  Assessment and Plan: Timothy Melton is a 55 y.o. male presenting with 4 days of nausea, vomiting, and abdominal pain. PMH is significant for chronic knee and back pain. Found to have acute hepatitis on presentation.   Hepatitis: improved. AST & ALT 994 & 1106 from 3026 & 6882 on admission. Total bilirubin 3.3 from 3.6. INR 1.53. Ammonia 52. UDS negative. EtOH and Salicylate normal. BP normotensive. CT abdomen with multiple hepatic cysts and possibly acute cholecystitis. RUQ [place] with minimal wall thickening but not cholelithiasis or other findings suggestive of acute cholecystitis. MELD-Na score 19 this morning (6% 54-month mortality). No jaundice or fever. No encephalopathy.  - f/u HIV, CMV, EBV, ANA, LDH, fractionated bili - Consider abdominal dopplers if above negative to rule out Budd-Chiari syndrome - Trend LFTs, INR - Frequent neuro checks - Consider starting lactulose if shows signs of encephalopathy - Fentanyl 43mcg q2hrs prn pain, though will be careful to not oversedate patient  Leukopenia / Thrombocytopenia. Improved. WBC 3.9 and platelets 106 on admission. Likely related to acute hepatic failure. - Trend CBC  AKI / Hyponatremia: resolved. Cr 1.28>1.06 on admission (b/l 0.95 - 1.0). Na 132>135. Likely secondary to dehyrdation due to nausea/vomiting.  - s/p 2L NS in ED - Avoid nephrotoxic medications - NS @150cc  for 12 hours - will re-evaluate fluid status in AM, anticipate switching to maintenance fluids - Trend renal function, electrolytes  FEN/GI:  -NS @ 125cc/hr,  -ADAT  Prophylaxis: SCD given  thrombocytopenia  Disposition: med-surg floor.  Subjective:  Reports RUQ pain that he describes as cramping. Denies N/V/D. No recent medicine. No recent uncooked food. No family history of liver disease.  Objective: Temp:  [98.2 F (36.8 C)-98.7 F (37.1 C)] 98.7 F (37.1 C) (01/29 0520) Pulse Rate:  [73-99] 75 (01/29 0520) Resp:  [14-20] 18 (01/29 0520) BP: (121-135)/(68-78) 129/68 mmHg (01/29 0520) SpO2:  [96 %-100 %] 96 % (01/29 0520) Weight:  [113 lb 6.4 oz (51.438 kg)] 113 lb 6.4 oz (51.438 kg) (01/28 2220) Physical Exam: Gen: appears well, able to sit up in bed for exam easily Eyes: sclera anicteric Oropharynx: MM appears dry CV: regular rate and rythm. S1 & S2 audible Resp: no apparent work of breathing, clear to auscultation bilaterally. GI: bowel sounds normal, tender to palpation over RUQ, no splenomegaly, not able to assess hepatomegaly or murphy sign due to pain, no sign of ascites, no spider angioma, no astrexisis GU: no suprapubic tenderness Skin: no spider angioma MSK: normal muscle bulk, no astrexisis  Laboratory:  Recent Labs Lab 10/03/15 1504 10/03/15 2140 10/04/15 0658  WBC 3.5* 3.9* 3.9*  HGB 17.1* 15.4 14.3  HCT 48.2 44.6 41.9  PLT 80* 86* 106*    Recent Labs Lab 10/03/15 1504 10/04/15 0658  NA 132* 135  K 4.6 4.0  CL 98* 102  CO2 24 26  BUN 16 12  CREATININE 1.28* 1.06  CALCIUM 8.9 7.8*  PROT 5.9* 4.8*  BILITOT 3.6* 3.3*  ALKPHOS 103 87  ALT 6882* 1106*  AST 3026* 994*  GLUCOSE 100* 77    Imaging/Diagnostic Tests: Ct Abdomen Pelvis W Contrast  10/03/2015  CLINICAL DATA:  Patient woke up on Tuesday  with nausea, vomiting, and abdominal pain. EXAM: CT ABDOMEN AND PELVIS WITH CONTRAST TECHNIQUE: Multidetector CT imaging of the abdomen and pelvis was performed using the standard protocol following bolus administration of intravenous contrast. CONTRAST:  152mL OMNIPAQUE IOHEXOL 300 MG/ML  SOLN COMPARISON:  04/13/2015 FINDINGS: Mild  dependent changes in the lung bases. Multiple low-attenuation lesions demonstrated throughout the liver. These appear unchanged since previous study and likely represent multiple cysts. There is a small amount of pericholecystic fluid around the liver which is nonspecific but could indicate cholecystitis. No radiopaque stones are demonstrated. No bile duct dilatation. The pancreas, spleen, adrenal glands, kidneys, abdominal aorta, inferior vena cava, and retroperitoneal lymph nodes are unremarkable. Stomach, small bowel, and colon are not abnormally distended. Stool fills the colon. No free air or free fluid in the abdomen. Abdominal wall musculature appears intact. Pelvis: Prostate gland is mildly enlarged. Bladder wall is not thickened. No free or loculated pelvic fluid collections. No pelvic mass or lymphadenopathy. The appendix is normal. Degenerative changes in the spine. Degenerative changes in the hips. No destructive bone lesions. IMPRESSION: Multiple up attic cysts demonstrated. Small amount pericholecystic edema may indicate acute cholecystitis. No radiopaque stones are identified in the gallbladder. No bile duct dilatation. Mild prostate enlargement. Electronically Signed   By: Lucienne Capers M.D.   On: 10/03/2015 19:39   US Abdomen Limited  10/03/2015  CLINICAL DATA:  Initial evaluation for possible acute cholecystitis. EXAM: US ABDOMEN LIMITED - RIGHT UPPER QUADRANT COMPARISON:  Prior CT from earlier the same day. FINDINGS: Gallbladder: No shadowing echogenic stones or sludge identified within the gallbladder lumen. Gallbladder wall minimally thickened to 4 mm. Trace free pericholecystic fluid present. No sonographic Murphy sign elicited on exam. Common bile duct: Diameter: 5.2 mm. Liver: Within normal limits for echogenicity. Multiple anechoic cysts seen within the liver without internal vascularity. Largest of these was located in the left lobe and measured 1.8 x 2.0 x 2.0 cm. Thin avascular  internal septation noted. Additional scattered cysts within the liver similar to previous CT. IMPRESSION: 1. Minimal gallbladder wall thickening up to 4 mm with trace pericholecystic free fluid. No cholelithiasis or other sonographic features to suggest acute cholecystitis. Finding may be related to the patient's overall volume status. No biliary dilatation. 2. Scattered hepatic cysts, better evaluated on CT performed earlier the same day. Electronically Signed   By: Jeannine Boga M.D.   On: 10/03/2015 21:14    Mercy Riding, MD 10/04/2015, 10:32 AM PGY-1, [place] Intern pager: [phone], text pages welcome

## 2015-10-04 NOTE — Progress Notes (Signed)
Utilization review completed.  

## 2015-10-05 LAB — HEPATITIS PANEL, ACUTE
HCV Ab: <0.1 s/co ratio (ref 0.0–0.9)
HEP B C IGM: NEGATIVE
HEP B S AG: NEGATIVE
Hep A IgM: NEGATIVE

## 2015-10-05 LAB — LIPID PANEL
CHOL/HDL RATIO: 9.3 ratio
CHOLESTEROL: 196 mg/dL (ref 0–200)
HDL: 21 mg/dL — ABNORMAL LOW (ref >40)
LDL Cholesterol: 127 mg/dL — ABNORMAL HIGH (ref 0–99)
Triglycerides: 239 mg/dL — ABNORMAL HIGH (ref <150)
VLDL: 48 mg/dL — AB (ref 0–40)

## 2015-10-05 LAB — COMPREHENSIVE METABOLIC PANEL
ALBUMIN: 2.7 g/dL — AB (ref 3.5–5.0)
ALK PHOS: 100 U/L (ref 38–126)
ALT: 2890 U/L — AB (ref 17–63)
AST: 329 U/L — AB (ref 15–41)
Anion gap: 8 (ref 5–15)
BILIRUBIN TOTAL: 2.1 mg/dL — AB (ref 0.3–1.2)
BUN: 10 mg/dL (ref 6–20)
CO2: 27 mmol/L (ref 22–32)
CREATININE: 1.05 mg/dL (ref 0.61–1.24)
Calcium: 8.2 mg/dL — ABNORMAL LOW (ref 8.9–10.3)
Chloride: 101 mmol/L (ref 101–111)
GFR calc Af Amer: >60 mL/min (ref >60)
GLUCOSE: 93 mg/dL (ref 65–99)
Potassium: 4.2 mmol/L (ref 3.5–5.1)
Sodium: 136 mmol/L (ref 135–145)
TOTAL PROTEIN: 5.2 g/dL — AB (ref 6.5–8.1)

## 2015-10-05 LAB — CBC
HEMATOCRIT: 42.4 % (ref 39.0–52.0)
Hemoglobin: 14.8 g/dL (ref 13.0–17.0)
MCH: 30.3 pg (ref 26.0–34.0)
MCHC: 34.9 g/dL (ref 30.0–36.0)
MCV: 86.7 fL (ref 78.0–100.0)
PLATELETS: 138 10*3/uL — AB (ref 150–400)
RBC: 4.89 MIL/uL (ref 4.22–5.81)
RDW: 14.5 % (ref 11.5–15.5)
WBC: 5.1 10*3/uL (ref 4.0–10.5)

## 2015-10-05 LAB — EBV AB TO VIRAL CAPSID AG PNL, IGG+IGM

## 2015-10-05 LAB — CMV ANTIBODY, IGG (EIA)

## 2015-10-05 LAB — CMV IGM

## 2015-10-05 LAB — MAGNESIUM: MAGNESIUM: 1.7 mg/dL (ref 1.7–2.4)

## 2015-10-05 LAB — TSH: TSH: 1.871 u[IU]/mL (ref 0.350–4.500)

## 2015-10-05 LAB — PHOSPHORUS: Phosphorus: 2.5 mg/dL (ref 2.5–4.6)

## 2015-10-05 LAB — T4, FREE: FREE T4: 1.05 ng/dL (ref 0.61–1.12)

## 2015-10-05 LAB — PROTIME-INR
INR: 1.29 (ref 0.00–1.49)
PROTHROMBIN TIME: 16.2 s — AB (ref 11.6–15.2)

## 2015-10-05 MED ORDER — FENTANYL CITRATE (PF) 100 MCG/2ML IJ SOLN
25.0000 ug | INTRAMUSCULAR | Status: DC | PRN
  Administered 2015-10-06: 25 ug via INTRAVENOUS
  Filled 2015-10-05: qty 2

## 2015-10-05 MED ORDER — ONDANSETRON HCL 4 MG PO TABS
4.0000 mg | ORAL_TABLET | Freq: Two times a day (BID) | ORAL | Status: DC
  Administered 2015-10-05: 4 mg via ORAL
  Filled 2015-10-05 (×2): qty 1

## 2015-10-05 NOTE — Care Management Note (Signed)
Case Management Note  Patient Details  Name: Astin Hankinson MRN: [id number] Date of Birth: 07-06-1961  Subjective/Objective:                Independent patient lives at home in [place] with wife and 2 teenage children. Patient does not have insurance, and states that he has not been to listed PCP in a long time. Patient agreeable to go to [facility] and have meds filled at [facility]. Appointment made, in AVS.   Action/Plan:   Expected Discharge Date:                  Expected Discharge Plan:  Home/Self Care  In-House Referral:     Discharge planning Services  CM Consult, [facility]  Post Acute Care Choice:    Choice offered to:     DME Arranged:    DME Agency:     HH Arranged:    [place] Agency:     Status of Service:  Completed, signed off  Medicare Important Message Given:    Date Medicare IM Given:    Medicare IM give by:    Date Additional Medicare IM Given:    Additional Medicare Important Message give by:     If discussed at [place] of Stay Meetings, dates discussed:    Additional Comments:  Carles Collet, RN 10/05/2015, 10:57 AM

## 2015-10-05 NOTE — Progress Notes (Signed)
Family Medicine Teaching Service Daily Progress Note Intern Pager: [phone]  Patient name: Timothy Melton Medical record number: [id number] Date of birth: 10-Nov-1960 Age: 56 y.o. Gender: male  Primary Care Provider: Jeb Levering, Philbert Riser, MD Consultants: none Code Status: full  Pt Overview and Major Events to Date:  1/29-admitted with acute hepatitis  Assessment and Plan: Timothy Melton is a 55 y.o. male presenting with 4 days of nausea, vomiting, and abdominal pain. PMH is significant for chronic knee and back pain. Found to have acute hepatitis on presentation.   Hepatitis: improving.  AST [id number]. ALT [id number]. Total bilirubin 3.6>2.1. UDS negative. EtOH and Salicylate normal. BP normotensive. CT abdomen with multiple hepatic cysts and possibly acute cholecystitis. RUQ [place] with minimal wall thickening but not cholelithiasis or other findings suggestive of acute cholecystitis. MELD-Na score 14 this morning (6% 47-month mortality). No jaundice or fever. No encephalopathy.  He has some nausea and abdominal pain. Tolerating diet. Differentials are ischemic, viral, autoimmune, hemochromatosis & cholelithiasis. Acute rise and fall in liver enzymes and elevated LDH suggestive for ischemic insult although patient was hemodynamically stable on presentation. - f/u hepatitis panel, CMV, EBV, ANA - HSV panel - Consider hemochromatosis, ceruloplasmin, urine Cu but less likely cause in acute hepatitis - Trend LFTs, INR -  Lipid panel, TFT -  Spacing out Fentanyl 49mcg to q4hrs prn pain (recieved once last night and once this morning) - Advancing diet.  Leukopenia / Thrombocytopenia. Improved. WBC 5.1 (3.1 on admission) and platelets 138 (80 on admission). Likely consumptive process from acute liver injury. - Trend CBC  AKI / Hyponatremia: resolved. Cr 1.28>1.05 on admission (b/l 0.95 - 1.0). Na 132>136. Likely secondary to dehyrdation due to nausea/vomiting.  - s/p 2L NS in ED -  Avoid nephrotoxic medications - KVO  FEN/GI:  -KVO -Regular diet  Prophylaxis: SCD given thrombocytopenia  Disposition: med-surg floor. Will discharge home if liver enzymes stable tomorrow.  Subjective:  Reports some nausea and RUQ pain. Tolerated his dinner. Hasn't eaten his breakfast yet. Asks if he can go off the floor to lobby.  Objective: Temp:  [98.4 F (36.9 C)-99.1 F (37.3 C)] 98.4 F (36.9 C) (01/30 0602) Pulse Rate:  [67-84] 72 (01/30 0602) Resp:  [18] 18 (01/30 0602) BP: (107-134)/(40-70) 107/40 mmHg (01/30 0602) SpO2:  [96 %-99 %] 96 % (01/30 0602) Physical Exam: Gen: appears well, able to sit up in bed for exam easily Eyes: sclera anicteric Oropharynx: MM appears dry CV: regular rate and rythm. S1 & S2 audible Resp: no apparent work of breathing, clear to auscultation bilaterally. GI: bowel sounds normal, tender to palpation over RUQ, no splenomegaly, not able to assess hepatomegaly or murphy sign due to pain, no sign of ascites, no spider angioma, no astrexisis GU: no suprapubic tenderness Skin: no spider angioma MSK: normal muscle bulk, no astrexisis  Laboratory:  Recent Labs Lab 10/03/15 1504 10/03/15 2140 10/04/15 0658  WBC 3.5* 3.9* 3.9*  HGB 17.1* 15.4 14.3  HCT 48.2 44.6 41.9  PLT 80* 86* 106*    Recent Labs Lab 10/03/15 1504 10/04/15 0658 10/04/15 1130  NA 132* 135  --   K 4.6 4.0  --   CL 98* 102  --   CO2 24 26  --   BUN 16 12  --   CREATININE 1.28* 1.06  --   CALCIUM 8.9 7.8*  --   PROT 5.9* 4.8*  --   BILITOT 3.6* 3.0*  3.3* 3.1*  ALKPHOS 103 87  --  ALT 6882* 1106*  --   AST 3026* 994*  --   GLUCOSE 100* 77  --     Imaging/Diagnostic Tests: Ct Abdomen Pelvis W Contrast  10/03/2015  CLINICAL DATA:  Patient woke up on Tuesday with nausea, vomiting, and abdominal pain. EXAM: CT ABDOMEN AND PELVIS WITH CONTRAST TECHNIQUE: Multidetector CT imaging of the abdomen and pelvis was performed using the standard protocol following  bolus administration of intravenous contrast. CONTRAST:  142mL OMNIPAQUE IOHEXOL 300 MG/ML  SOLN COMPARISON:  04/13/2015 FINDINGS: Mild dependent changes in the lung bases. Multiple low-attenuation lesions demonstrated throughout the liver. These appear unchanged since previous study and likely represent multiple cysts. There is a small amount of pericholecystic fluid around the liver which is nonspecific but could indicate cholecystitis. No radiopaque stones are demonstrated. No bile duct dilatation. The pancreas, spleen, adrenal glands, kidneys, abdominal aorta, inferior vena cava, and retroperitoneal lymph nodes are unremarkable. Stomach, small bowel, and colon are not abnormally distended. Stool fills the colon. No free air or free fluid in the abdomen. Abdominal wall musculature appears intact. Pelvis: Prostate gland is mildly enlarged. Bladder wall is not thickened. No free or loculated pelvic fluid collections. No pelvic mass or lymphadenopathy. The appendix is normal. Degenerative changes in the spine. Degenerative changes in the hips. No destructive bone lesions. IMPRESSION: Multiple up attic cysts demonstrated. Small amount pericholecystic edema may indicate acute cholecystitis. No radiopaque stones are identified in the gallbladder. No bile duct dilatation. Mild prostate enlargement. Electronically Signed   By: Lucienne Capers M.D.   On: 10/03/2015 19:39   US Abdomen Limited  10/03/2015  CLINICAL DATA:  Initial evaluation for possible acute cholecystitis. EXAM: US ABDOMEN LIMITED - RIGHT UPPER QUADRANT COMPARISON:  Prior CT from earlier the same day. FINDINGS: Gallbladder: No shadowing echogenic stones or sludge identified within the gallbladder lumen. Gallbladder wall minimally thickened to 4 mm. Trace free pericholecystic fluid present. No sonographic Murphy sign elicited on exam. Common bile duct: Diameter: 5.2 mm. Liver: Within normal limits for echogenicity. Multiple anechoic cysts seen within  the liver without internal vascularity. Largest of these was located in the left lobe and measured 1.8 x 2.0 x 2.0 cm. Thin avascular internal septation noted. Additional scattered cysts within the liver similar to previous CT. IMPRESSION: 1. Minimal gallbladder wall thickening up to 4 mm with trace pericholecystic free fluid. No cholelithiasis or other sonographic features to suggest acute cholecystitis. Finding may be related to the patient's overall volume status. No biliary dilatation. 2. Scattered hepatic cysts, better evaluated on CT performed earlier the same day. Electronically Signed   By: Jeannine Boga M.D.   On: 10/03/2015 21:14    Mercy Riding, MD 10/05/2015, 7:20 AM PGY-1, [place] Intern pager: [phone], text pages welcome

## 2015-10-05 NOTE — Progress Notes (Signed)
Patient received pamphlet from [facility]. CM explained to patient that they may use the on site pharmacy to fill prescriptions given to them at discharge. Patient aware that the [facility] will not fill narcotics or pain medications prior to the patient being seen by one of their physicians.  Patient aware that they must be seen as a patient prior to the pharmacy filling the prescriptions a second time.  

## 2015-10-06 LAB — ANTINUCLEAR ANTIBODIES, IFA
ANA Ab, IFA: NEGATIVE
ANA Ab, IFA: NEGATIVE

## 2015-10-06 LAB — COMPREHENSIVE METABOLIC PANEL
ALK PHOS: 118 U/L (ref 38–126)
ALT: 2127 U/L — AB (ref 17–63)
AST: 200 U/L — ABNORMAL HIGH (ref 15–41)
Albumin: 3 g/dL — ABNORMAL LOW (ref 3.5–5.0)
Anion gap: 10 (ref 5–15)
BILIRUBIN TOTAL: 1.9 mg/dL — AB (ref 0.3–1.2)
BUN: 8 mg/dL (ref 6–20)
CALCIUM: 8.6 mg/dL — AB (ref 8.9–10.3)
CO2: 25 mmol/L (ref 22–32)
CREATININE: 1.13 mg/dL (ref 0.61–1.24)
Chloride: 103 mmol/L (ref 101–111)
GFR calc non Af Amer: >60 mL/min (ref >60)
GLUCOSE: 143 mg/dL — AB (ref 65–99)
Potassium: 3.6 mmol/L (ref 3.5–5.1)
SODIUM: 138 mmol/L (ref 135–145)
TOTAL PROTEIN: 5.7 g/dL — AB (ref 6.5–8.1)

## 2015-10-06 LAB — CBC
HCT: 44.3 % (ref 39.0–52.0)
Hemoglobin: 15.1 g/dL (ref 13.0–17.0)
MCH: 29.6 pg (ref 26.0–34.0)
MCHC: 34.1 g/dL (ref 30.0–36.0)
MCV: 86.9 fL (ref 78.0–100.0)
Platelets: 174 10*3/uL (ref 150–400)
RBC: 5.1 MIL/uL (ref 4.22–5.81)
RDW: 14.9 % (ref 11.5–15.5)
WBC: 6 10*3/uL (ref 4.0–10.5)

## 2015-10-06 LAB — T3, FREE: T3, Free: 2.3 pg/mL (ref 2.0–4.4)

## 2015-10-06 MED ORDER — NICOTINE 21 MG/24HR TD PT24: 21.0000 mg | MEDICATED_PATCH | Freq: Every day | TRANSDERMAL | Status: DC

## 2015-10-06 MED ORDER — TRAMADOL HCL 50 MG PO TABS: 50.0000 mg | ORAL_TABLET | Freq: Four times a day (QID) | ORAL | Status: DC | PRN

## 2015-10-06 MED ORDER — ONDANSETRON HCL 4 MG PO TABS: 4.0000 mg | ORAL_TABLET | Freq: Two times a day (BID) | ORAL | Status: DC

## 2015-10-06 MED ORDER — NICOTINE POLACRILEX 2 MG MT GUM: 2.0000 mg | CHEWING_GUM | OROMUCOSAL | Status: DC | PRN

## 2015-10-06 NOTE — Discharge Instructions (Addendum)
It has been a pleasure taking care of you! You were admitted due to acute liver injury. Not clear what has caused this after the studies we have done. However, the tests we have done shows significant improvement over the course of your stay in the hospital.  We are discharging you on some pain medication that you can continue to take after you leave the hospital.   Things to avoid: -Tylenol or tylenol containing products -Herbal or diet supplements -Alcoholic beverages -Over the counter medications before you talk to your doctor  Your good cholesterol is low. This along with smoking can increase your risk of having stroke or other complications. We strongly recommened you quit smoking. You can use nicotine patches or lozenges to help with this.   You also need a follow up with your primary care doctor. Please, give their office a call and schedule a follow up appointment as soon as possible  Take care,

## 2015-10-06 NOTE — Progress Notes (Signed)
CSW consulted regarding Medicaid. CSW advised patient to fill out an application at [employer]  CSW signing off.  Timothy Melton Locus Timothy Melton Snare LCSWA [phone]

## 2015-10-06 NOTE — Progress Notes (Signed)
D/c to home ambulating per request.All belongings given as well as d/c instructions given.

## 2015-10-06 NOTE — Discharge Summary (Signed)
[facility] Discharge Summary  Patient name: Timothy Melton Medical record number: [id number] Date of birth: 1961/03/05 Age: 55 y.o. Gender: male Date of Admission: 10/03/2015  Date of Discharge: 10/06/2015 Admitting Physician: Blane Ohara McDiarmid, MD  Primary Care Provider: Jeb Levering, Philbert Riser, MD Consultants: none  Indication for Hospitalization: acute hepatitis  Discharge Diagnoses/Problem List:  Patient Active Problem List   Diagnosis Date Noted  . Acute hepatitis 10/03/2015  . ED (erectile dysfunction) 02/12/2011   Dyslipidemia 10/06/2015  . Kidney stones 02/12/2011    Disposition: home  Discharge Condition: stable  Discharge Exam: Temp: [98.2 F (36.8 C)-98.6 F (37 C)] 98.2 F (36.8 C) (01/31 0559) Pulse Rate: [66-78] 66 (01/31 0559) Resp: [17-18] 17 (01/31 0559) BP: (111-136)/(72-77) 111/72 mmHg (01/31 0559) SpO2: [97 %-99 %] 97 % (01/31 0559) Physical Exam: Gen: appears well, able to sit up in bed for exam easily Eyes: sclera anicteric Oropharynx: MM appears dry CV: regular rate and rythm. S1 & S2 audible Resp: no apparent work of breathing, clear to auscultation bilaterally. GI: bowel sounds normal, tender to palpation over RUQ, no splenomegaly, not able to assess hepatomegaly or murphy sign due to pain, no sign of ascites, no spider angioma, no astrexisis GU: no suprapubic tenderness Skin: no spider angioma MSK: normal muscle bulk, no astrexisis  Brief Hospital Course:  Timothy Melton is a 55 y.o. male presenting with 4 days of nausea, vomiting, and abdominal pain. PMH is significant for chronic knee and back pain. Found to have acute hepatitis on presentation.   Hepatitis: improved.nausea, emesis and abdominal pain on presntation. No jaundice, fever, encephalopathy, splenomegaly or sign of ascitis. AST 3026>994>326>200. ALT 6882>1100>2890>2100. Total bilirubin 3.6>2.1>1.9. UDS negative. EtOH and Salicylate normal. BP  normotensive. CT abdomen with multiple hepatic cysts and possibly acute cholecystitis. RUQ [place] with minimal wall thickening but not cholelithiasis or other findings suggestive of acute cholecystitis. Hepatitis panel ([facility]) and CMV serologies negative. EBV IgG high to 600 but IgM negative. ANA negative. Lipid panel with low HDL. MELD-Na score 14 on discharge (6% 52-month mortality). Acute rise and fall in liver enzymes and elevated LDH suggestive for ischemic insult although patient was hemodynamically stable on presentation. However, normal BP in patient with significant pain is misleading. There are also reported cases of EBV hepatitis with reactivation with sequestration of EBV infected lymphocytes in liver. His WBC and platelet were 3.1 & 80 on admission. Didn't test for hemochromatosis and Wilson's disease as the acute nature of his hepatitis makes them unlikely. Normal alkaline phosphate makes obstructive pathology less likely. Patient's pain was managed by IV fentanyl and improved. He tolerated regular meals. He was ambulatory off the floor to lobby on his own. He was discharged on tramadol as needed for pain and specific instructions (see after visit summary). Recommend checking CMP at follow up.  Leukopenia / Thrombocytopenia. resolved. WBC 6.1 (3.1 on admission) and platelets 174 (80 on admission). Likely consumptive process from acute liver injury. Leucocytic sequestration in liver can happens with EBV. He has no splenomegaly. Recommend repeating CBC at follow up.  AKI / Hyponatremia: resolved. Cr 1.28>1.13 on admission (b/l 0.95 - 1.0). Na 132>136. Likely secondary to dehyrdation due to nausea/vomiting. Gave bolus and maintenance IVF.  Dyslipidemia: total CH 196, TG 239, HDL 21, VDL 127. ASCVD score 11.2%. Can't start statins now given his acute hepatitis. Advised patient to quit smoking and pursue healthy life style to decrease is risk for stroke. Also recommended baby ASA and fish oil. Can  start on  statin when his hepatitis resolve and monitor liver enzymes closely  Issues for Follow Up:  1. Hepatitis: repeat CMP 2. Leukopenia/Thrombocytopenia: resolved on discharge. Recommend checking CBC at follow up 3. Dyslipidemia: emphasize healthy life style. Consider statin when hepatitis resolve  Significant Procedures: none  Significant Labs and Imaging:   Recent Labs Lab 10/04/15 0658 10/05/15 0645 10/06/15 0831  WBC 3.9* 5.1 6.0  HGB 14.3 14.8 15.1  HCT 41.9 42.4 44.3  PLT 106* 138* 174    Recent Labs Lab 10/03/15 1504 10/04/15 0658 10/05/15 0645 10/06/15 0831  NA 132* 135 136 138  K 4.6 4.0 4.2 3.6  CL 98* 102 101 103  CO2 24 26 27 25   GLUCOSE 100* 77 93 143*  BUN 16 12 10 8   CREATININE 1.28* 1.06 1.05 1.13  CALCIUM 8.9 7.8* 8.2* 8.6*  MG  --   --  1.7  --   PHOS  --   --  2.5  --   ALKPHOS 103 87 100 118  AST 3026* 994* 329* 200*  ALT 6882* 1106* 2890* 2127*  ALBUMIN 3.1* 2.5* 2.7* 3.0*   Ct Abdomen Pelvis W Contrast  10/03/2015  CLINICAL DATA:  Patient woke up on Tuesday with nausea, vomiting, and abdominal pain. EXAM: CT ABDOMEN AND PELVIS WITH CONTRAST TECHNIQUE: Multidetector CT imaging of the abdomen and pelvis was performed using the standard protocol following bolus administration of intravenous contrast. CONTRAST:  149mL OMNIPAQUE IOHEXOL 300 MG/ML  SOLN COMPARISON:  04/13/2015 FINDINGS: Mild dependent changes in the lung bases. Multiple low-attenuation lesions demonstrated throughout the liver. These appear unchanged since previous study and likely represent multiple cysts. There is a small amount of pericholecystic fluid around the liver which is nonspecific but could indicate cholecystitis. No radiopaque stones are demonstrated. No bile duct dilatation. The pancreas, spleen, adrenal glands, kidneys, abdominal aorta, inferior vena cava, and retroperitoneal lymph nodes are unremarkable. Stomach, small bowel, and colon are not abnormally distended. Stool fills  the colon. No free air or free fluid in the abdomen. Abdominal wall musculature appears intact. Pelvis: Prostate gland is mildly enlarged. Bladder wall is not thickened. No free or loculated pelvic fluid collections. No pelvic mass or lymphadenopathy. The appendix is normal. Degenerative changes in the spine. Degenerative changes in the hips. No destructive bone lesions. IMPRESSION: Multiple up attic cysts demonstrated. Small amount pericholecystic edema may indicate acute cholecystitis. No radiopaque stones are identified in the gallbladder. No bile duct dilatation. Mild prostate enlargement. Electronically Signed   By: Lucienne Capers M.D.   On: 10/03/2015 19:39   US Abdomen Limited  10/03/2015  CLINICAL DATA:  Initial evaluation for possible acute cholecystitis. EXAM: US ABDOMEN LIMITED - RIGHT UPPER QUADRANT COMPARISON:  Prior CT from earlier the same day. FINDINGS: Gallbladder: No shadowing echogenic stones or sludge identified within the gallbladder lumen. Gallbladder wall minimally thickened to 4 mm. Trace free pericholecystic fluid present. No sonographic Murphy sign elicited on exam. Common bile duct: Diameter: 5.2 mm. Liver: Within normal limits for echogenicity. Multiple anechoic cysts seen within the liver without internal vascularity. Largest of these was located in the left lobe and measured 1.8 x 2.0 x 2.0 cm. Thin avascular internal septation noted. Additional scattered cysts within the liver similar to previous CT. IMPRESSION: 1. Minimal gallbladder wall thickening up to 4 mm with trace pericholecystic free fluid. No cholelithiasis or other sonographic features to suggest acute cholecystitis. Finding may be related to the patient's overall volume status. No biliary dilatation. 2.  Scattered hepatic cysts, better evaluated on CT performed earlier the same day. Electronically Signed   By: Jeannine Boga M.D.   On: 10/03/2015 21:14   Results/Tests Pending at Time of Discharge:  none  Discharge Medications:    Medication List    STOP taking these medications        aspirin 325 MG tablet     methocarbamol 500 MG tablet  Commonly known as:  ROBAXIN      TAKE these medications        nicotine 21 mg/24hr patch  Commonly known as:  NICODERM CQ - dosed in mg/24 hours  Place 1 patch (21 mg total) onto the skin daily.     nicotine polacrilex 2 MG gum  Commonly known as:  NICORETTE  Take 1 each (2 mg total) by mouth as needed for smoking cessation.     ondansetron 4 MG tablet  Commonly known as:  ZOFRAN  Take 1 tablet (4 mg total) by mouth every 12 (twelve) hours.     traMADol 50 MG tablet  Commonly known as:  ULTRAM  Take 1 tablet (50 mg total) by mouth every 6 (six) hours as needed.        Discharge Instructions: Please refer to Patient Instructions section of EMR for full details.  Patient was counseled important signs and symptoms that should prompt return to medical care, changes in medications, dietary instructions, activity restrictions, and follow up appointments.   Follow-Up Appointments: Follow-up Information    Follow up with [facility] On 11/02/2015.   Specialty:  Internal Medicine   Why:  AT 3:PM.You may fill your meds at the [facility] when you leave the hospital. Please bring your photo ID, and arrive 10 minutes early to your appointment. You must call to reschedule or cancel appointment if you cannot make it.    Contact information:   [place]      Mercy Riding, MD 10/06/2015, 9:34 PM PGY-1, [place]

## 2015-10-06 NOTE — Progress Notes (Signed)
Family Medicine Teaching Service Daily Progress Note Intern Pager: [phone]  Patient name: Timothy Melton Medical record number: [id number] Date of birth: [birth year] Age: 55 y.o. Gender: male  Primary Care Provider: Jeb Levering, Philbert Riser, MD Consultants: none Code Status: full  Pt Overview and Major Events to Date:  1/29-admitted with acute hepatitis  Assessment and Plan: Timothy Melton is a 55 y.o. male presenting with 4 days of nausea, vomiting, and abdominal pain. PMH is significant for chronic knee and back pain. Found to have acute hepatitis on presentation.   Hepatitis: improved.  AST 3026>994>326>200. ALT 6882>1100>2890>2200. Total bilirubin 3.6>2.1>1.9. UDS negative. EtOH and Salicylate normal. BP normotensive. CT abdomen with multiple hepatic cysts and possibly acute cholecystitis. RUQ [place] with minimal wall thickening but not cholelithiasis or other findings suggestive of acute cholecystitis. MELD-Na score 14 this morning (6% 6-month mortality). No jaundice or fever. No encephalopathy.  He has some nausea and abdominal pain. Tolerating diet. Differentials are ischemic, viral, autoimmune, hemochromatosis & cholelithiasis. Acute rise and fall in liver enzymes and elevated LDH suggestive for ischemic insult although patient was hemodynamically stable on presentation. Hepatitis panel and CMV serologies negative. EBV IgG high but IgM negative. ANA negative. Lipid panel with low HDL - Discharge on tramadol  Leukopenia / Thrombocytopenia. resolved. WBC 6.1 (3.1 on admission) and platelets 174 (80 on admission). Likely consumptive process from acute liver injury. Leucocytic sequestration in liver can happens with EBV. He has no splenomegaly.  AKI / Hyponatremia: resolved. Cr 1.28>1.13 on admission (b/l 0.95 - 1.0). Na 132>136. Likely secondary to dehyrdation due to nausea/vomiting.  - s/p 2L NS in ED - Avoid nephrotoxic medications - KVO  Dyslipidemia: total CH 196, TG 239, HDL 21,  VDL 127. ASCVD score 11.2% -Can't start statins now given his acute hepatitis  -Can reduce his risk by quitting smoking, healthy life style and baby ASA.  - PCP can start statin when stable  FEN/GI:  -Regular diet  Prophylaxis: SCD given thrombocytopenia  Disposition: discharge home today  Subjective: Reports some nausea, RUQ pain and weakness. He was able to go outside and get fresh air. Tolerating by mouth.  Objective: Temp:  [98.2 F (36.8 C)-98.6 F (37 C)] 98.2 F (36.8 C) (01/31 0559) Pulse Rate:  [66-78] 66 (01/31 0559) Resp:  [17-18] 17 (01/31 0559) BP: (111-136)/(72-77) 111/72 mmHg (01/31 0559) SpO2:  [97 %-99 %] 97 % (01/31 0559) Physical Exam: Gen: appears well, able to sit up in bed for exam easily Eyes: sclera anicteric Oropharynx: MM appears dry CV: regular rate and rythm. S1 & S2 audible Resp: no apparent work of breathing, clear to auscultation bilaterally. GI: bowel sounds normal, tender to palpation over RUQ, no splenomegaly, not able to assess hepatomegaly or murphy sign due to pain, no sign of ascites, no spider angioma, no astrexisis GU: no suprapubic tenderness Skin: no spider angioma MSK: normal muscle bulk, no astrexisis  Laboratory:  Recent Labs Lab 10/03/15 2140 10/04/15 0658 10/05/15 0645  WBC 3.9* 3.9* 5.1  HGB 15.4 14.3 14.8  HCT 44.6 41.9 42.4  PLT 86* 106* 138*    Recent Labs Lab 10/03/15 1504 10/04/15 0658 10/04/15 1130 10/05/15 0645  NA 132* 135  --  136  K 4.6 4.0  --  4.2  CL 98* 102  --  101  CO2 24 26  --  27  BUN 16 12  --  10  CREATININE 1.28* 1.06  --  1.05  CALCIUM 8.9 7.8*  --  8.2*  PROT  5.9* 4.8*  --  5.2*  BILITOT 3.6* 3.0*  3.3* 3.1* 2.1*  ALKPHOS 103 87  --  100  ALT 6882* 1106*  --  2890*  AST 3026* 994*  --  329*  GLUCOSE 100* 77  --  93    Imaging/Diagnostic Tests: Ct Abdomen Pelvis W Contrast  10/03/2015  CLINICAL DATA:  Patient woke up on Tuesday with nausea, vomiting, and abdominal pain. EXAM:  CT ABDOMEN AND PELVIS WITH CONTRAST TECHNIQUE: Multidetector CT imaging of the abdomen and pelvis was performed using the standard protocol following bolus administration of intravenous contrast. CONTRAST:  138mL OMNIPAQUE IOHEXOL 300 MG/ML  SOLN COMPARISON:  04/13/2015 FINDINGS: Mild dependent changes in the lung bases. Multiple low-attenuation lesions demonstrated throughout the liver. These appear unchanged since previous study and likely represent multiple cysts. There is a small amount of pericholecystic fluid around the liver which is nonspecific but could indicate cholecystitis. No radiopaque stones are demonstrated. No bile duct dilatation. The pancreas, spleen, adrenal glands, kidneys, abdominal aorta, inferior vena cava, and retroperitoneal lymph nodes are unremarkable. Stomach, small bowel, and colon are not abnormally distended. Stool fills the colon. No free air or free fluid in the abdomen. Abdominal wall musculature appears intact. Pelvis: Prostate gland is mildly enlarged. Bladder wall is not thickened. No free or loculated pelvic fluid collections. No pelvic mass or lymphadenopathy. The appendix is normal. Degenerative changes in the spine. Degenerative changes in the hips. No destructive bone lesions. IMPRESSION: Multiple up attic cysts demonstrated. Small amount pericholecystic edema may indicate acute cholecystitis. No radiopaque stones are identified in the gallbladder. No bile duct dilatation. Mild prostate enlargement. Electronically Signed   By: Lucienne Capers M.D.   On: 10/03/2015 19:39   US Abdomen Limited  10/03/2015  CLINICAL DATA:  Initial evaluation for possible acute cholecystitis. EXAM: US ABDOMEN LIMITED - RIGHT UPPER QUADRANT COMPARISON:  Prior CT from earlier the same day. FINDINGS: Gallbladder: No shadowing echogenic stones or sludge identified within the gallbladder lumen. Gallbladder wall minimally thickened to 4 mm. Trace free pericholecystic fluid present. No sonographic  Murphy sign elicited on exam. Common bile duct: Diameter: 5.2 mm. Liver: Within normal limits for echogenicity. Multiple anechoic cysts seen within the liver without internal vascularity. Largest of these was located in the left lobe and measured 1.8 x 2.0 x 2.0 cm. Thin avascular internal septation noted. Additional scattered cysts within the liver similar to previous CT. IMPRESSION: 1. Minimal gallbladder wall thickening up to 4 mm with trace pericholecystic free fluid. No cholelithiasis or other sonographic features to suggest acute cholecystitis. Finding may be related to the patient's overall volume status. No biliary dilatation. 2. Scattered hepatic cysts, better evaluated on CT performed earlier the same day. Electronically Signed   By: Jeannine Boga M.D.   On: 10/03/2015 21:14    Mercy Riding, MD 10/06/2015, 8:08 AM PGY-1, [place] Intern pager: [phone], text pages welcome

## 2015-11-02 ENCOUNTER — Encounter: Payer: Self-pay | Admitting: Family Medicine

## 2015-11-02 ENCOUNTER — Ambulatory Visit (INDEPENDENT_AMBULATORY_CARE_PROVIDER_SITE_OTHER): Payer: Self-pay | Admitting: Family Medicine

## 2015-11-02 VITALS — BP 157/74 | HR 122 | Temp 97.9°F | Ht 66.0 in | Wt 170.0 lb

## 2015-11-02 DIAGNOSIS — Z1211 Encounter for screening for malignant neoplasm of colon: Secondary | ICD-10-CM

## 2015-11-02 DIAGNOSIS — K7200 Acute and subacute hepatic failure without coma: Secondary | ICD-10-CM

## 2015-11-02 LAB — CBC WITH DIFFERENTIAL/PLATELET
BASOS ABS: 0 10*3/uL (ref 0.0–0.1)
Basophils Relative: 0 % (ref 0–1)
EOS ABS: 0.2 10*3/uL (ref 0.0–0.7)
EOS PCT: 2 % (ref 0–5)
HCT: 43.6 % (ref 39.0–52.0)
Hemoglobin: 15 g/dL (ref 13.0–17.0)
LYMPHS ABS: 3.2 10*3/uL (ref 0.7–4.0)
Lymphocytes Relative: 36 % (ref 12–46)
MCH: 30.7 pg (ref 26.0–34.0)
MCHC: 34.4 g/dL (ref 30.0–36.0)
MCV: 89.3 fL (ref 78.0–100.0)
MONO ABS: 0.5 10*3/uL (ref 0.1–1.0)
MPV: 9.2 fL (ref 8.6–12.4)
Monocytes Relative: 6 % (ref 3–12)
Neutro Abs: 5 10*3/uL (ref 1.7–7.7)
Neutrophils Relative %: 56 % (ref 43–77)
PLATELETS: 181 10*3/uL (ref 150–400)
RBC: 4.88 MIL/uL (ref 4.22–5.81)
RDW: 16.3 % — AB (ref 11.5–15.5)
WBC: 9 10*3/uL (ref 4.0–10.5)

## 2015-11-02 LAB — COMPLETE METABOLIC PANEL WITH GFR
ALT: 32 U/L (ref 9–46)
AST: 29 U/L (ref 10–35)
Albumin: 3.8 g/dL (ref 3.6–5.1)
Alkaline Phosphatase: 99 U/L (ref 40–115)
BILIRUBIN TOTAL: 0.6 mg/dL (ref 0.2–1.2)
BUN: 15 mg/dL (ref 7–25)
CALCIUM: 9.4 mg/dL (ref 8.6–10.3)
CO2: 28 mmol/L (ref 20–31)
CREATININE: 1.22 mg/dL (ref 0.70–1.33)
Chloride: 103 mmol/L (ref 98–110)
GFR, EST AFRICAN AMERICAN: 77 mL/min (ref >=60)
GFR, Est Non African American: 66 mL/min (ref >=60)
Glucose, Bld: 93 mg/dL (ref 65–99)
Potassium: 4 mmol/L (ref 3.5–5.3)
Sodium: 140 mmol/L (ref 135–146)
TOTAL PROTEIN: 6.6 g/dL (ref 6.1–8.1)

## 2015-11-02 NOTE — Progress Notes (Signed)
Patient ID: Oslo Mulhearn, male   DOB: Oct 24, 1960, 55 y.o.   MRN: [id number]   Diyari Caminero, is a 55 y.o. male  [id number]  [id number]  DOB - 05/10/1961  CC:  Chief Complaint  Patient presents with  . new patient/get established    recent admission, here for follow up, complaints of bilateral feet swelling, some discomfort on bottom of feet and some itching noted, would like to discuss smoking sensation       HPI: Sreekar Manner is a 55 y.o. male here to establish care  No Known Allergies Past Medical History  Diagnosis Date  . Kidney stones   . Chronic back pain   . Memory loss   . Knee pain   . Substance abuse    Current Outpatient Prescriptions on File Prior to Visit  Medication Sig Dispense Refill  . nicotine (NICODERM CQ - DOSED IN MG/24 HOURS) 21 mg/24hr patch Place 1 patch (21 mg total) onto the skin daily. (Patient not taking: Reported on 11/02/2015) 28 patch 0  . nicotine polacrilex (NICORETTE) 2 MG gum Take 1 each (2 mg total) by mouth as needed for smoking cessation. (Patient not taking: Reported on 11/02/2015) 100 tablet 0  . ondansetron (ZOFRAN) 4 MG tablet Take 1 tablet (4 mg total) by mouth every 12 (twelve) hours. (Patient not taking: Reported on 11/02/2015) 20 tablet 0  . traMADol (ULTRAM) 50 MG tablet Take 1 tablet (50 mg total) by mouth every 6 (six) hours as needed. (Patient not taking: Reported on 11/02/2015) 20 tablet 0   No current facility-administered medications on file prior to visit.   Family History  Problem Relation Age of Onset  . Alcohol abuse Mother   . Stroke Father   . Heart disease Father   . Diabetes Father   . Hypertension Father    Social History   Social History  . Marital Status: Married    Spouse Name: N/A  . Number of Children: 4  . Years of Education: N/A   Occupational History  . [employer]   Social History Main Topics  . Smoking status: Current Every Day Smoker -- 1.00 packs/day    Types:  Cigarettes  . Smokeless tobacco: Never Used  . Alcohol Use: Yes     Comment: occ  . Drug Use: No     Comment: in rehab  . Sexual Activity: Not on file   Other Topics Concern  . Not on file   Social History Narrative    Review of Systems: Constitutional: Negative for fever, chills, appetite change, weight loss,  Fatigue. Skin: Negative for rashes or lesions of concern. HENT: Negative for ear pain, ear discharge.nose bleeds Eyes: Negative for pain, discharge, redness, itching and visual disturbance. Neck: Negative for pain, stiffness Respiratory: Negative for cough, shortness of breath,   Cardiovascular: Negative for chest pain, palpitations and leg swelling. Gastrointestinal: Negative for abdominal pain, nausea, vomiting, diarrhea, constipations Genitourinary: Negative for dysuria, urgency, frequency, hematuria,  Musculoskeletal: Negative for back pain, joint pain, joint  swelling, and gait problem.Negative for weakness. Neurological: Negative for dizziness, tremors, seizures, syncope,   light-headedness, numbness and headaches.  Hematological: Negative for easy bruising or bleeding Psychiatric/Behavioral: Negative for depression, anxiety, decreased concentration, confusion   Objective:   Filed Vitals:   11/02/15 1517  BP: 157/74  Pulse: 122  Temp: 97.9 F (36.6 C)    Physical Exam: Constitutional: Patient appears well-developed and well-nourished. No distress. HENT: Normocephalic, atraumatic, External right and left ear normal.  Oropharynx is clear and moist.  Eyes: Conjunctivae and EOM are normal. PERRLA, no scleral icterus. Neck: Normal ROM. Neck supple. No lymphadenopathy, No thyromegaly. CVS: RRR, S1/S2 +, no murmurs, no gallops, no rubs Pulmonary: Effort and breath sounds normal, no stridor, rhonchi, wheezes, rales.  Abdominal: Soft. Normoactive BS,, no distension, tenderness, rebound or guarding.  Musculoskeletal: Normal range of motion. No edema and no  tenderness.  Neuro: Alert.Normal muscle tone coordination. Non-focal Skin: Skin is warm and dry. No rash noted. Not diaphoretic. No erythema. No pallor. Psychiatric: Normal mood and affect. Behavior, judgment, thought content normal.  Lab Results  Component Value Date   WBC 6.0 10/06/2015   HGB 15.1 10/06/2015   HCT 44.3 10/06/2015   MCV 86.9 10/06/2015   PLT 174 10/06/2015   Lab Results  Component Value Date   CREATININE 1.13 10/06/2015   BUN 8 10/06/2015   NA 138 10/06/2015   K 3.6 10/06/2015   CL 103 10/06/2015   CO2 25 10/06/2015    No results found for: HGBA1C Lipid Panel     Component Value Date/Time   CHOL 196 10/05/2015 1330   TRIG 239* 10/05/2015 1330   HDL 21* 10/05/2015 1330   CHOLHDL 9.3 10/05/2015 1330   VLDL 48* 10/05/2015 1330   LDLCALC 127* 10/05/2015 1330       Assessment and plan:   1. Acute liver failure  - COMPLETE METABOLIC PANEL WITH GFR - CBC with Differential  2. Colon cancer screening  - POC Hemoccult Bld/Stl (3-Cd Home Screen); Future   Return in about 6 months (around 05/01/2016).  The patient was given clear instructions to go to ER or return to medical center if symptoms don't improve, worsen or new problems develop. The patient verbalized understanding.    Micheline Chapman FNP  11/02/2015, 4:26 PM

## 2015-11-06 ENCOUNTER — Other Ambulatory Visit (INDEPENDENT_AMBULATORY_CARE_PROVIDER_SITE_OTHER): Payer: Self-pay

## 2015-11-06 DIAGNOSIS — Z1211 Encounter for screening for malignant neoplasm of colon: Secondary | ICD-10-CM

## 2015-11-06 LAB — POC HEMOCCULT BLD/STL (HOME/3-CARD/SCREEN)
CARD #2 DATE: 3022017
Card #1 Date: 3012017
Card #2 Fecal Occult Blod, POC: NEGATIVE
Card #3 Date: 3032017
FECAL OCCULT BLD: NEGATIVE
FECAL OCCULT BLD: NEGATIVE

## 2016-07-24 ENCOUNTER — Encounter (HOSPITAL_COMMUNITY): Payer: Self-pay | Admitting: Emergency Medicine

## 2016-07-24 ENCOUNTER — Ambulatory Visit (HOSPITAL_COMMUNITY)
Admission: EM | Admit: 2016-07-24 | Discharge: 2016-07-24 | Disposition: A | Discharge status: Home / Self Care | Payer: BC Managed Care – PPO | Attending: Emergency Medicine | Admitting: Emergency Medicine

## 2016-07-24 DIAGNOSIS — Z79899 Other long term (current) drug therapy: Secondary | ICD-10-CM | POA: Insufficient documentation

## 2016-07-24 DIAGNOSIS — J029 Acute pharyngitis, unspecified: Secondary | ICD-10-CM

## 2016-07-24 DIAGNOSIS — F17210 Nicotine dependence, cigarettes, uncomplicated: Secondary | ICD-10-CM | POA: Insufficient documentation

## 2016-07-24 LAB — POCT RAPID STREP A: Streptococcus, Group A Screen (Direct): NEGATIVE

## 2016-07-24 MED ORDER — ACETAMINOPHEN 325 MG PO TABS
ORAL_TABLET | ORAL | Status: AC
  Filled 2016-07-24: qty 2

## 2016-07-24 MED ORDER — ACETAMINOPHEN 325 MG PO TABS
650.0000 mg | ORAL_TABLET | Freq: Once | ORAL | Status: AC
  Administered 2016-07-24: 650 mg via ORAL

## 2016-07-24 MED ORDER — CEFDINIR 300 MG PO CAPS: 300.0000 mg | ORAL_CAPSULE | Freq: Two times a day (BID) | ORAL | 0 refills | Status: DC

## 2016-07-24 MED ORDER — DEXAMETHASONE SODIUM PHOSPHATE 10 MG/ML IJ SOLN
10.0000 mg | Freq: Once | INTRAMUSCULAR | Status: AC
  Administered 2016-07-24: 10 mg via INTRAMUSCULAR

## 2016-07-24 MED ORDER — DEXAMETHASONE SODIUM PHOSPHATE 10 MG/ML IJ SOLN
INTRAMUSCULAR | Status: AC
  Filled 2016-07-24: qty 1

## 2016-07-24 NOTE — ED Triage Notes (Signed)
Patient presents to [facility] with sore throat, he denies any fever but states that he has had some difficulty swallowing and cough. Patient states that he has had chills.

## 2016-07-24 NOTE — Discharge Instructions (Signed)
Warm salt water gargles and tylenol for comfort.

## 2016-07-24 NOTE — ED Provider Notes (Signed)
CSN: [id number]     Arrival date & time 07/24/16  1636 History   None    Chief Complaint  Patient presents with  . Sore Throat   (Consider location/radiation/quality/duration/timing/severity/associated sxs/prior Treatment)  HPI   The patient is a 55 year old man who works at [employer].  States he thinks he was exposed to strep throat and has had an extremely sore throat times 2 days.  Denies n/v/d.  States some non-productive cough.  Has been having some chills and sweats at night.  Denies SOB or other issues.   Past Medical History:  Diagnosis Date  . Chronic back pain   . Kidney stones   . Knee pain   . Memory loss   . Substance abuse    Past Surgical History:  Procedure Laterality Date  . TONSILLECTOMY     Family History  Problem Relation Age of Onset  . Alcohol abuse Mother   . Stroke Father   . Heart disease Father   . Diabetes Father   . Hypertension Father    Social History  Substance Use Topics  . Smoking status: Current Every Day Smoker    Packs/day: 1.00    Types: Cigarettes  . Smokeless tobacco: Never Used  . Alcohol use Yes     Comment: occ    Review of Systems  Constitutional: Negative.  Negative for fatigue and fever.  HENT: Positive for sore throat. Negative for congestion, ear discharge, ear pain, trouble swallowing and voice change.   Eyes: Negative.   Respiratory: Negative.  Negative for cough and shortness of breath.   Cardiovascular: Negative.   Gastrointestinal: Negative.  Negative for diarrhea, nausea and vomiting.  Endocrine: Negative.   Genitourinary: Negative.   Musculoskeletal: Negative.   Skin: Negative.   Allergic/Immunologic: Negative.   Neurological: Negative.  Negative for headaches.  Hematological: Negative.   Psychiatric/Behavioral: Negative.     Allergies  Patient has no known allergies.  Home Medications   Prior to Admission medications   Medication Sig Start Date End Date Taking? Authorizing Provider  cefdinir  (OMNICEF) 300 MG capsule Take 1 capsule (300 mg total) by mouth 2 (two) times daily. 07/24/16   Nehemiah Settle, NP  nicotine (NICODERM CQ - DOSED IN MG/24 HOURS) 21 mg/24hr patch Place 1 patch (21 mg total) onto the skin daily. Patient not taking: Reported on 11/02/2015 10/06/15   Mercy Riding, MD  nicotine polacrilex (NICORETTE) 2 MG gum Take 1 each (2 mg total) by mouth as needed for smoking cessation. Patient not taking: Reported on 11/02/2015 10/06/15   Mercy Riding, MD  ondansetron (ZOFRAN) 4 MG tablet Take 1 tablet (4 mg total) by mouth every 12 (twelve) hours. Patient not taking: Reported on 11/02/2015 10/06/15   Mercy Riding, MD  traMADol (ULTRAM) 50 MG tablet Take 1 tablet (50 mg total) by mouth every 6 (six) hours as needed. Patient not taking: Reported on 11/02/2015 10/06/15   Mercy Riding, MD   Meds Ordered and Administered this Visit   Medications  acetaminophen (TYLENOL) tablet 650 mg (650 mg Oral Given 07/24/16 1925)  dexamethasone (DECADRON) injection 10 mg (10 mg Intramuscular Given 07/24/16 1927)    BP 145/77 (BP Location: Left Arm)   Pulse 77   Temp 99.4 F (37.4 C) (Oral)   Resp 16   SpO2 100%  No data found.   Physical Exam  Constitutional: He appears well-developed and well-nourished. No distress.  HENT:  Head: Normocephalic and atraumatic.  Right Ear: External ear normal.  Left Ear: External ear normal.  Nose: Nose normal.  Mouth/Throat: Oropharynx is clear and moist. No oropharyngeal exudate.  Posterior oropharynx beefy red with no exudate or patches noted.   Eyes: EOM are normal. Pupils are equal, round, and reactive to light. Right eye exhibits no discharge. Left eye exhibits no discharge. No scleral icterus.  Neck: Normal range of motion. Neck supple.  Negative for nuchal rigidity.   Cardiovascular: Normal rate, regular rhythm, normal heart sounds and intact distal pulses.  Exam reveals no gallop and no friction rub.   No murmur heard. Pulmonary/Chest:  Effort normal and breath sounds normal. No respiratory distress. He has no wheezes. He has no rales. He exhibits no tenderness.  Lymphadenopathy:    He has no cervical adenopathy.  Skin: Skin is warm and dry. Capillary refill takes less than 2 seconds. He is not diaphoretic.  Nursing note and vitals reviewed.   Urgent Care Course   Clinical Course     Procedures (including critical care time)  Labs Review Labs Reviewed  CULTURE, GROUP A STREP [facility])  POCT RAPID STREP A   Results for orders placed or performed during the hospital encounter of 07/24/16  POCT rapid strep A [facility])  Result Value Ref Range   Streptococcus, Group A Screen (Direct) NEGATIVE NEGATIVE    Imaging Review No results found.  Culture pending.  Treated based on reported discomfort and appearance.    MDM   1. Pharyngitis, unspecified etiology    Meds ordered this encounter  Medications  . acetaminophen (TYLENOL) tablet 650 mg  . dexamethasone (DECADRON) injection 10 mg  . cefdinir (OMNICEF) 300 MG capsule    Sig: Take 1 capsule (300 mg total) by mouth 2 (two) times daily.    Dispense:  20 capsule    Refill:  0    The usual and customary discharge instructions and warnings were given.  The patient verbalizes understanding and agrees to plan of care.       Nehemiah Settle, NP 07/24/16 2137    Nehemiah Settle, NP 07/24/16 2138  Medical screening examination/treatment/procedure(s) were performed by resident physician or non-physician practitioner and as supervising physician I was immediately available for consultation/collaboration.  Maryruth Eve, MD      Melony Overly, MD 07/27/16 1314

## 2016-07-27 LAB — CULTURE, GROUP A STREP (THRC)

## 2016-09-16 IMAGING — CT CT RENAL STONE PROTOCOL
2 of 4 series · 16 of 46 positions shown, 18 images · non-contrast
Comparison: No priors.

CLINICAL DATA: 54-year-old male with left-sided flank pain for the
past 3 days.

EXAM:
CT ABDOMEN AND PELVIS WITHOUT CONTRAST
TECHNIQUE: Multidetector CT imaging of the abdomen and pelvis was performed
following the standard protocol without IV contrast.

[Series 2: renal stone > 200 lbs 5.0 b31f · axial · 0.75mm/px · z∈[-458,-78]mm · 13 of 84 slices shown, 15 images]
[im 4/84  soft-tissue]
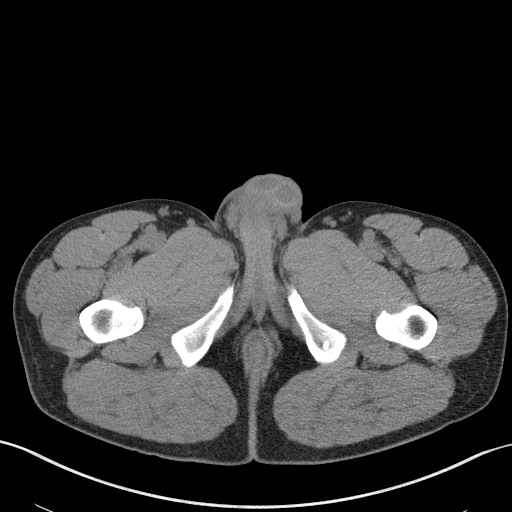
[im 4/84  bone]
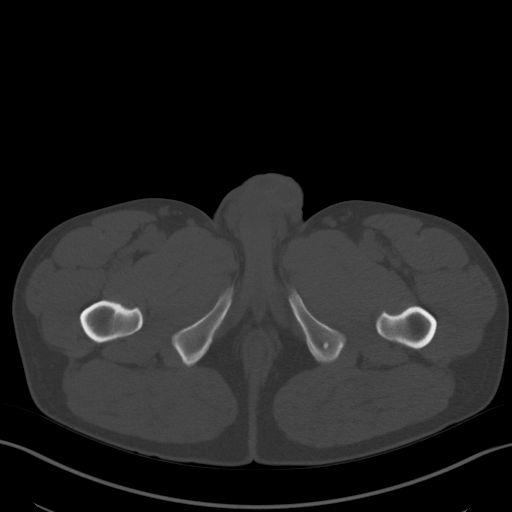
[im 11/84  soft-tissue]
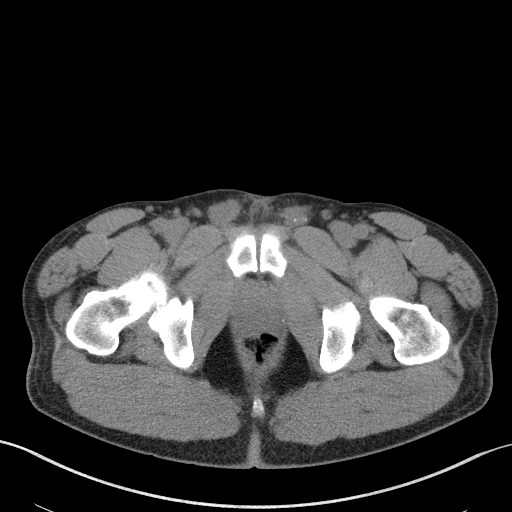
[im 19/84  soft-tissue]
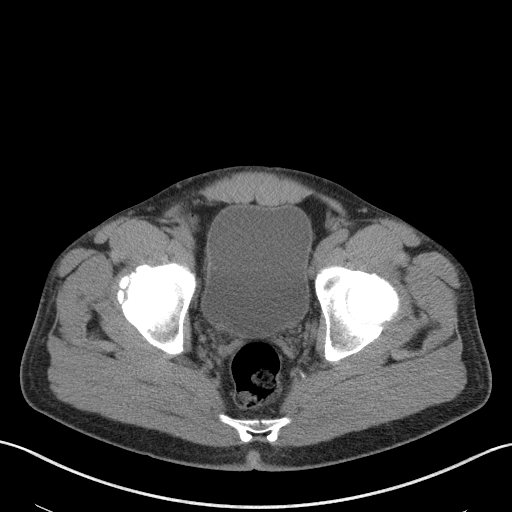
[im 22/84  soft-tissue]
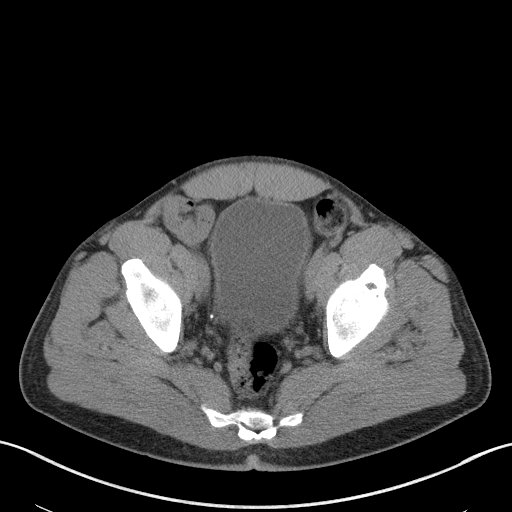
[im 29/84  soft-tissue]
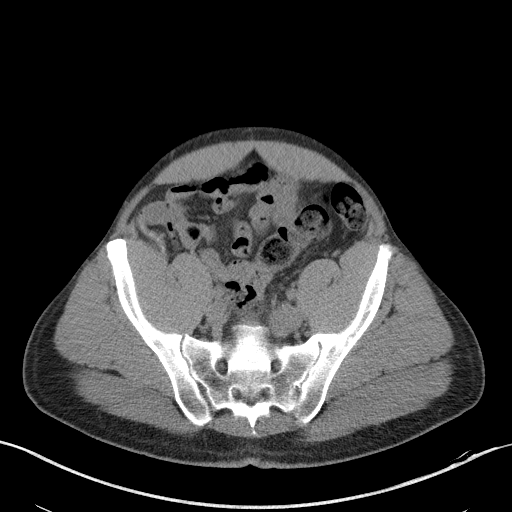
[im 37/84  soft-tissue]
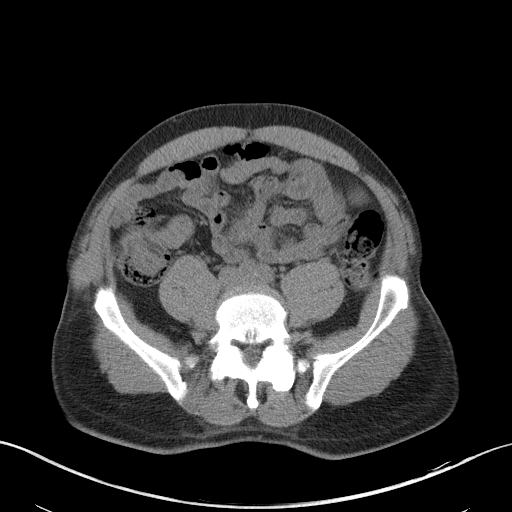
[im 44/84  soft-tissue]
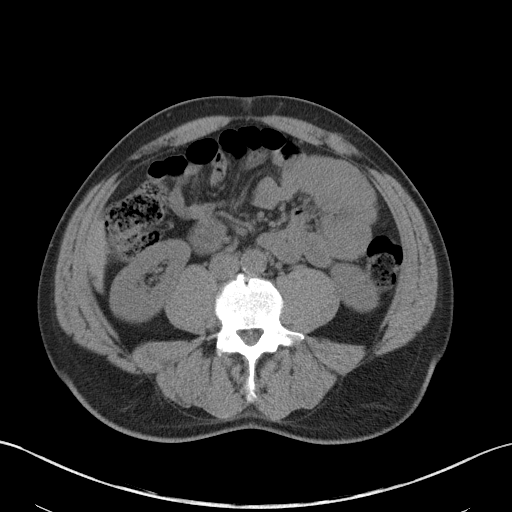
[im 47/84  soft-tissue]
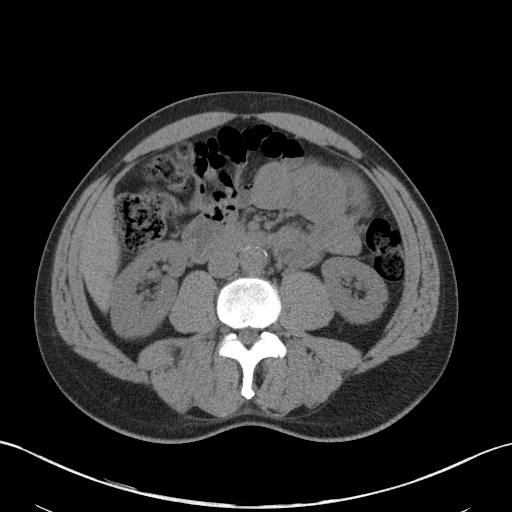
[im 55/84  soft-tissue]
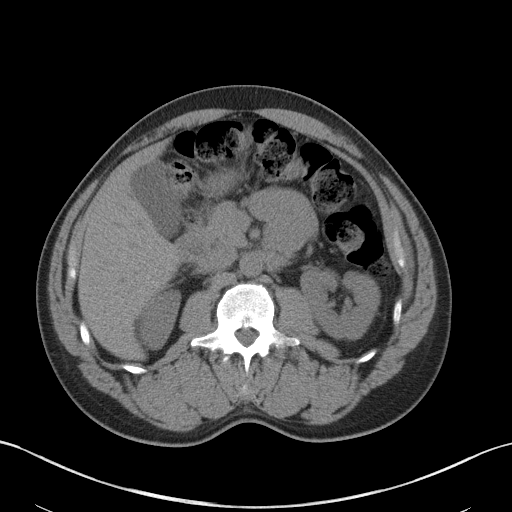
[im 55/84  bone]
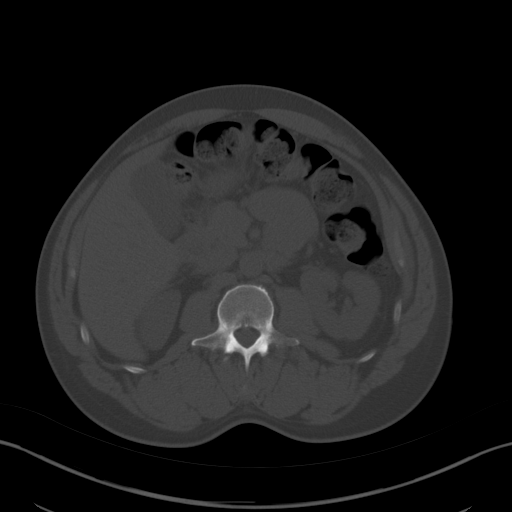
[im 62/84  soft-tissue]
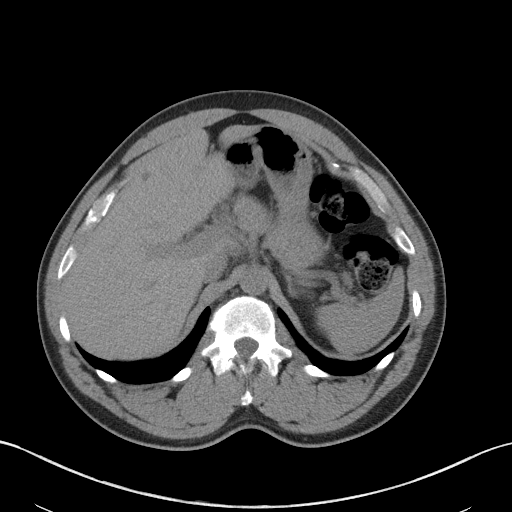
[im 65/84  soft-tissue]
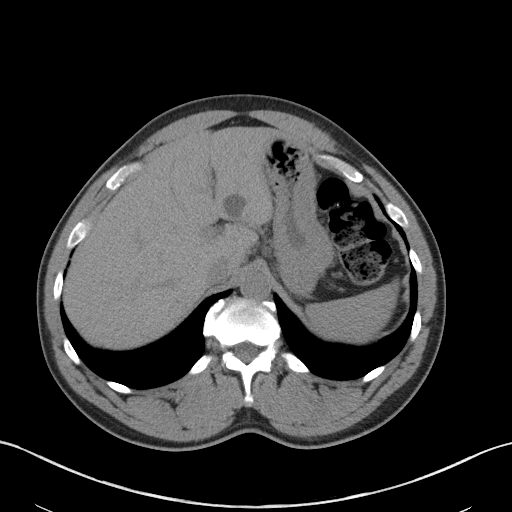
[im 73/84  soft-tissue]
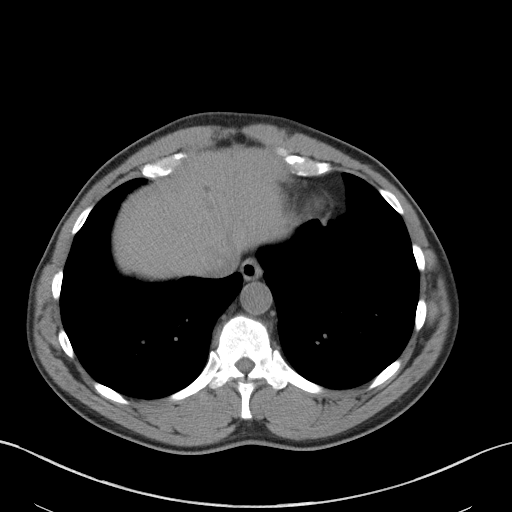
[im 80/84  soft-tissue]
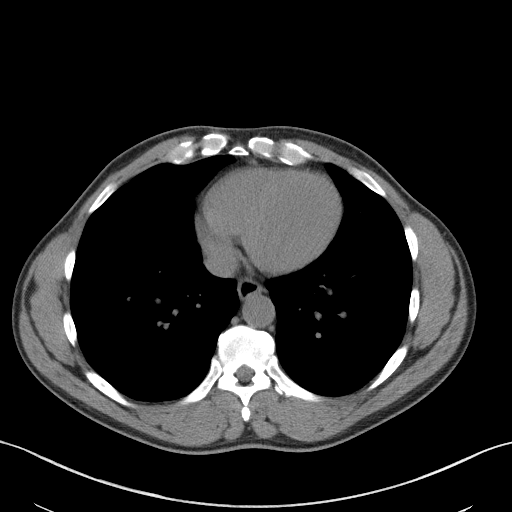

[Series 5: renal stone 3.0 coronal · coronal · 0.71mm/px · 3 of 89 slices shown]
[im 30/89  soft-tissue]
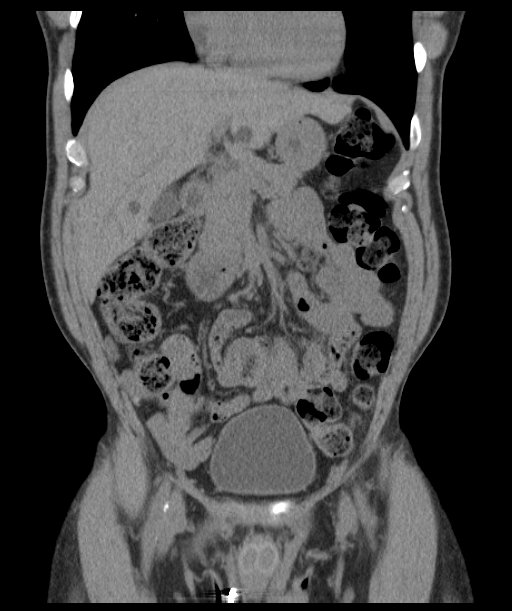
[im 40/89  soft-tissue]
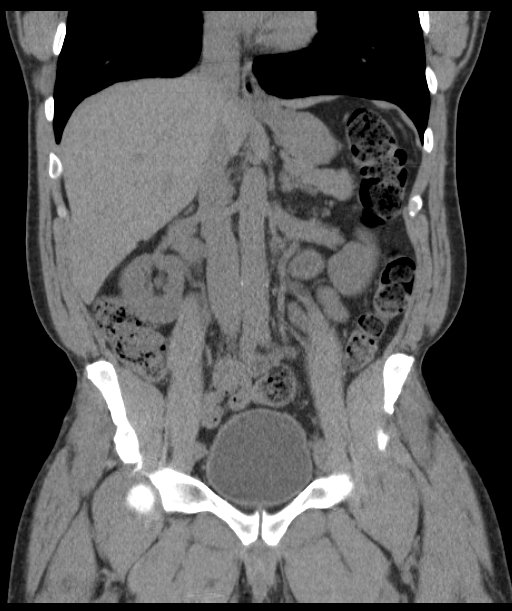
[im 49/89  soft-tissue]
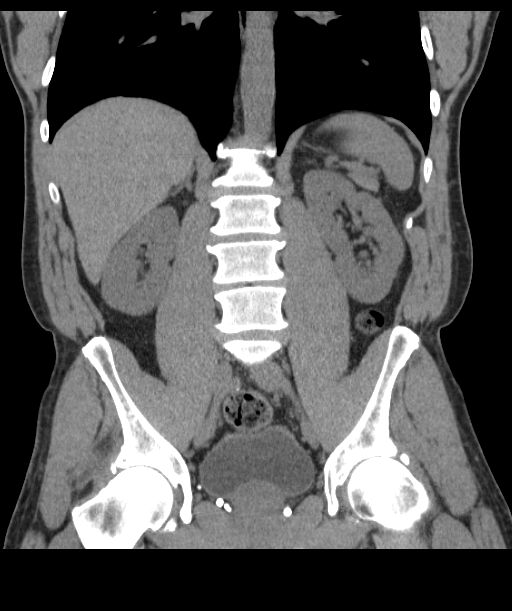

[16 of 46 positions shown; findings below may reference images not displayed]

FINDINGS: Lower chest:  Unremarkable.

Hepatobiliary: Multiple well-defined low-attenuation lesions in the
liver, incompletely characterized on today's noncontrast CT
examination, but favored to represent multiple cysts. The largest of
these measures 2 cm in segment 3. Gallbladder is unremarkable in
appearance.

Pancreas: No definite pancreatic mass or peripancreatic inflammatory
changes on today's noncontrast CT examination.

Spleen: Unremarkable.

Adrenals/Urinary Tract: Bilateral adrenal glands and bilateral
kidneys are normal in appearance. No calcifications are identified
within the collecting system of either kidney, along the course of
either ureter, or within the lumen of the urinary bladder. No
hydroureteronephrosis or perinephric stranding to indicate urinary
tract obstruction at this time. Urinary bladder is normal in
appearance.

Stomach/Bowel: Unenhanced appearance of the stomach is normal. No
pathologic dilatation of small bowel or colon. Normal appendix. High
density material within the appendix compatible with multiple small
appendicoliths.

Vascular/Lymphatic: Atherosclerotic calcifications throughout the
abdominal and pelvic vasculature, without evidence of aneurysm. No
lymphadenopathy noted in the abdomen or pelvis on today's
noncontrast CT examination.

Reproductive: Prostate gland and seminal vesicles are unremarkable
in appearance.

Other: Bilateral vasectomy clips. Numerous pelvic phleboliths. No
significant volume of ascites. No pneumoperitoneum.

Musculoskeletal: There are no aggressive appearing lytic or blastic
lesions noted in the visualized portions of the skeleton.
IMPRESSION: 1. No acute findings in the abdomen or pelvis to account for the
patient's symptoms. Specifically, no urinary tract calculi and no
findings of urinary tract obstruction.
2. Normal appendix. Multiple small appendicoliths are present within
the lumen, but there are no surrounding inflammatory changes at this
time.
3. Innumerable low-attenuation lesions in the liver, incompletely
characterized, but favored to represent small cysts.
4. Atherosclerosis.

## 2016-10-23 ENCOUNTER — Encounter (HOSPITAL_COMMUNITY): Payer: Self-pay | Admitting: Emergency Medicine

## 2016-10-23 ENCOUNTER — Emergency Department (HOSPITAL_COMMUNITY): Payer: BC Managed Care – PPO

## 2016-10-23 ENCOUNTER — Emergency Department (HOSPITAL_COMMUNITY)
Admission: EM | Admit: 2016-10-23 | Discharge: 2016-10-23 | Disposition: A | Discharge status: Home / Self Care | Payer: BC Managed Care – PPO | Attending: Emergency Medicine | Admitting: Emergency Medicine

## 2016-10-23 DIAGNOSIS — J189 Pneumonia, unspecified organism: Secondary | ICD-10-CM | POA: Insufficient documentation

## 2016-10-23 DIAGNOSIS — F17210 Nicotine dependence, cigarettes, uncomplicated: Secondary | ICD-10-CM | POA: Diagnosis not present

## 2016-10-23 DIAGNOSIS — R079 Chest pain, unspecified: Secondary | ICD-10-CM | POA: Diagnosis present

## 2016-10-23 HISTORY — DX: Hepatitis a without hepatic coma: B15.9

## 2016-10-23 LAB — BASIC METABOLIC PANEL
Anion gap: 9 (ref 5–15)
BUN: 12 mg/dL (ref 6–20)
CO2: 25 mmol/L (ref 22–32)
CREATININE: 1.09 mg/dL (ref 0.61–1.24)
Calcium: 8.6 mg/dL — ABNORMAL LOW (ref 8.9–10.3)
Chloride: 101 mmol/L (ref 101–111)
Glucose, Bld: 97 mg/dL (ref 65–99)
POTASSIUM: 4 mmol/L (ref 3.5–5.1)
SODIUM: 135 mmol/L (ref 135–145)

## 2016-10-23 LAB — CBC WITH DIFFERENTIAL/PLATELET
BASOS PCT: 0 %
Basophils Absolute: 0 10*3/uL (ref 0.0–0.1)
EOS ABS: 0.1 10*3/uL (ref 0.0–0.7)
EOS PCT: 1 %
HCT: 39.4 % (ref 39.0–52.0)
HEMOGLOBIN: 13.2 g/dL (ref 13.0–17.0)
LYMPHS ABS: 1.2 10*3/uL (ref 0.7–4.0)
Lymphocytes Relative: 30 %
MCH: 29.1 pg (ref 26.0–34.0)
MCHC: 33.5 g/dL (ref 30.0–36.0)
MCV: 87 fL (ref 78.0–100.0)
Monocytes Absolute: 0.5 10*3/uL (ref 0.1–1.0)
Monocytes Relative: 12 %
NEUTROS PCT: 57 %
Neutro Abs: 2.2 10*3/uL (ref 1.7–7.7)
PLATELETS: 178 10*3/uL (ref 150–400)
RBC: 4.53 MIL/uL (ref 4.22–5.81)
RDW: 14.2 % (ref 11.5–15.5)
WBC: 4 10*3/uL (ref 4.0–10.5)

## 2016-10-23 LAB — I-STAT TROPONIN, ED: TROPONIN I, POC: 0 ng/mL (ref 0.00–0.08)

## 2016-10-23 MED ORDER — AZITHROMYCIN 250 MG PO TABS: 250.0000 mg | ORAL_TABLET | Freq: Every day | ORAL | 0 refills | Status: AC

## 2016-10-23 MED ORDER — AZITHROMYCIN 250 MG PO TABS
500.0000 mg | ORAL_TABLET | Freq: Once | ORAL | Status: AC
  Administered 2016-10-23: 500 mg via ORAL
  Filled 2016-10-23: qty 2

## 2016-10-23 MED ORDER — ACETAMINOPHEN 325 MG PO TABS
650.0000 mg | ORAL_TABLET | Freq: Once | ORAL | Status: AC
  Administered 2016-10-23: 650 mg via ORAL
  Filled 2016-10-23: qty 2

## 2016-10-23 NOTE — ED Provider Notes (Signed)
I have personally seen and examined the patient. I have reviewed the documentation on PMH/FH/Soc Hx. I have discussed the plan of care with the resident and patient.  I have reviewed and agree with the resident's documentation. Please see associated encounter note.   EKG Interpretation  Date/Time:  Sunday October 23 2016 16:15:45 EST Ventricular Rate:  89 PR Interval:    QRS Duration: 91 QT Interval:  316 QTC Calculation: 385 R Axis:   -21 Text Interpretation:  Sinus rhythm Borderline left axis deviation Minimal ST depression, inferior leads J point elevation Otherwise no significant change Confirmed by [facility] MD, PEDRO ([id number]) on 10/23/2016 4:22:04 PM         Fatima Blank, MD 10/23/16 1816

## 2016-10-23 NOTE — ED Triage Notes (Signed)
Pt to ED via [facility] from work-- with flu like sx, fever, cough, sore throat, c/o chest pain with coughing and palpation. Also c/o left arm numbness. IV 18 g left forearm per EMS.

## 2016-10-23 NOTE — ED Provider Notes (Signed)
[facility] DEPT Provider Note   CSN: [id number] Arrival date & time: 10/23/16  1614     History   Chief Complaint Chief Complaint  Patient presents with  . flu like sx  . Chest Pain    HPI Timothy Melton is a 56 y.o. male.  The history is provided by the patient.  Chest Pain   This is a new problem. The current episode started yesterday. The problem occurs constantly. Progression since onset: waxing and waning. The pain is associated with coughing and movement. The pain is present in the lateral region. The pain is moderate. The quality of the pain is described as sharp. The pain radiates to the left arm. Episode Length: Intermittent episodes, longest lasting a few minutes. Associated symptoms include cough, a fever and headaches. Pertinent negatives include no abdominal pain, no back pain, no palpitations, no shortness of breath and no vomiting. He has tried nothing for the symptoms. Risk factors include male gender and smoking/tobacco exposure.  Pertinent negatives for past medical history include no seizures.  Pertinent negatives for family medical history include: no CAD, no heart disease and no early MI.    Past Medical History:  Diagnosis Date  . Chronic back pain   . Hepatitis A   . Kidney stones   . Knee pain   . Memory loss   . Substance abuse     Patient Active Problem List   Diagnosis Date Noted  . Acute hepatic failure 10/03/2015  . Acute liver failure without hepatic coma   . AKI (acute kidney injury) ([facility])   . General medical examination 02/12/2011  . Knee pain, left 02/12/2011  . Chronic back pain 02/12/2011  . ED (erectile dysfunction) 02/12/2011  . Kidney stones 02/12/2011  . Memory loss 02/12/2011  . Abdominal pain, acute, bilateral lower quadrant 02/12/2011    Past Surgical History:  Procedure Laterality Date  . TONSILLECTOMY         Home Medications    Prior to Admission medications   Medication Sig Start Date End Date Taking?  Authorizing Provider  azithromycin (ZITHROMAX) 250 MG tablet Take 1 tablet (250 mg total) by mouth daily. 10/23/16 10/27/16  Clifton James, MD  cefdinir (OMNICEF) 300 MG capsule Take 1 capsule (300 mg total) by mouth 2 (two) times daily. 07/24/16   Nehemiah Settle, NP  nicotine (NICODERM CQ - DOSED IN MG/24 HOURS) 21 mg/24hr patch Place 1 patch (21 mg total) onto the skin daily. Patient not taking: Reported on 11/02/2015 10/06/15   Mercy Riding, MD  nicotine polacrilex (NICORETTE) 2 MG gum Take 1 each (2 mg total) by mouth as needed for smoking cessation. Patient not taking: Reported on 11/02/2015 10/06/15   Mercy Riding, MD  ondansetron (ZOFRAN) 4 MG tablet Take 1 tablet (4 mg total) by mouth every 12 (twelve) hours. Patient not taking: Reported on 11/02/2015 10/06/15   Mercy Riding, MD  traMADol (ULTRAM) 50 MG tablet Take 1 tablet (50 mg total) by mouth every 6 (six) hours as needed. Patient not taking: Reported on 11/02/2015 10/06/15   Mercy Riding, MD    Family History Family History  Problem Relation Age of Onset  . Alcohol abuse Mother   . Stroke Father   . Heart disease Father   . Diabetes Father   . Hypertension Father     Social History Social History  Substance Use Topics  . Smoking status: Current Every Day Smoker    Packs/day: 1.00  Types: Cigarettes  . Smokeless tobacco: Never Used  . Alcohol use Yes     Comment: occ     Allergies   Patient has no known allergies.   Review of Systems Review of Systems  Constitutional: Positive for fever. Negative for chills.  HENT: Positive for congestion and sore throat. Negative for ear pain.   Eyes: Negative for pain and visual disturbance.  Respiratory: Positive for cough. Negative for shortness of breath.   Cardiovascular: Positive for chest pain. Negative for palpitations.  Gastrointestinal: Negative for abdominal pain and vomiting.  Genitourinary: Negative for dysuria and hematuria.  Musculoskeletal: Negative for  arthralgias and back pain.  Skin: Negative for color change and rash.  Neurological: Positive for headaches. Negative for seizures and syncope.  All other systems reviewed and are negative.    Physical Exam Updated Vital Signs BP 121/71 (BP Location: Right Arm)   Pulse 68   Temp 99.9 F (37.7 C) (Oral)   Resp 18   Ht 5\' 5"  (1.651 m)   Wt 77.1 kg   SpO2 99%   BMI 28.29 kg/m   Physical Exam  Constitutional: He is oriented to person, place, and time. He appears well-developed and well-nourished.  HENT:  Head: Normocephalic and atraumatic.  Mouth/Throat: Oropharynx is clear and moist. No oropharyngeal exudate.  Eyes: Conjunctivae are normal. Pupils are equal, round, and reactive to light.  Neck: Normal range of motion. Neck supple.  Cardiovascular: Normal rate, regular rhythm and normal heart sounds.   No murmur heard. Pulmonary/Chest: Effort normal and breath sounds normal. No respiratory distress. He has no wheezes. He has no rales. He exhibits tenderness (tenderness throughout left and right chest wall).  Abdominal: Soft. He exhibits no distension and no mass. There is no tenderness. There is no rebound and no guarding.  Musculoskeletal: He exhibits no edema, tenderness or deformity.  Lymphadenopathy:    He has no cervical adenopathy.  Neurological: He is alert and oriented to person, place, and time.  Skin: Skin is warm and dry.  Psychiatric: He has a normal mood and affect.  Nursing note and vitals reviewed.    ED Treatments / Results  Labs (all labs ordered are listed, but only abnormal results are displayed) Labs Reviewed  BASIC METABOLIC PANEL - Abnormal; Notable for the following:       Result Value   Calcium 8.6 (*)    All other components within normal limits  CBC WITH DIFFERENTIAL/PLATELET  Randolm Idol, ED    EKG  EKG Interpretation  Date/Time:  Sunday October 23 2016 16:15:45 EST Ventricular Rate:  89 PR Interval:    QRS Duration: 91 QT  Interval:  316 QTC Calculation: 385 R Axis:   -21 Text Interpretation:  Sinus rhythm Borderline left axis deviation Minimal ST depression, inferior leads J point elevation Otherwise no significant change Confirmed by [facility] MD, PEDRO ([id number]) on 10/23/2016 4:22:04 PM       Radiology Dg Chest 2 View  Result Date: 10/23/2016 CLINICAL DATA:  Patient with flu-like symptoms, fever, cough and sore throat. Chest pain. EXAM: CHEST  2 VIEW COMPARISON:  Chest radiograph 09/24/2008. FINDINGS: Stable cardiac and mediastinal contours. Cannot exclude subtle early infiltrate within the right mid and upper lung. No pleural effusion or pneumothorax. Thoracic spine degenerative changes. IMPRESSION: Suggestion of possible early infiltrate within the right mid and upper lung. Electronically Signed   By: Lovey Newcomer M.D.   On: 10/23/2016 17:34    Procedures Procedures (including critical care time)  Medications Ordered in ED Medications  acetaminophen (TYLENOL) tablet 650 mg (650 mg Oral Given 10/23/16 1657)  azithromycin (ZITHROMAX) tablet 500 mg (500 mg Oral Given 10/23/16 1820)     Initial Impression / Assessment and Plan / ED Course  I have reviewed the triage vital signs and the nursing notes.  Pertinent labs & imaging results that were available during my care of the patient were reviewed by me and considered in my medical decision making (see chart for details).    Patient is a 56 year old male with history as above who presents with 2 days of cough, congestion, fever, sore throat, and chest pain. He does smoke, but otherwise has no symptoms and risk factors for coronary artery disease. His chest pain is intermittent and primarily associated with coughing. It was worst last night when he was getting up from sleep with a cough. His EKG showed no significant changes from prior. Troponin was negative. Remainder of his labs were unremarkable. Chest x-ray showed findings consistent with early right middle  lobe infiltrate. Will treat patient for community-acquired pneumonia. First dose of azithromycin given in ED. Discharged in stable condition. Return precautions discussed in detail. He is ambulating without difficulty.  Final Clinical Impressions(s) / ED Diagnoses   Final diagnoses:  Community acquired pneumonia of right lung, unspecified part of lung    New Prescriptions New Prescriptions   AZITHROMYCIN (ZITHROMAX) 250 MG TABLET    Take 1 tablet (250 mg total) by mouth daily.       Clifton James, MD 10/23/16 [id number]

## 2017-03-25 ENCOUNTER — Encounter (HOSPITAL_COMMUNITY): Payer: Self-pay | Admitting: Emergency Medicine

## 2017-03-25 ENCOUNTER — Emergency Department (HOSPITAL_COMMUNITY)
Admission: EM | Admit: 2017-03-25 | Discharge: 2017-03-25 | Disposition: A | Discharge status: Home / Self Care | Payer: BC Managed Care – PPO | Attending: Emergency Medicine | Admitting: Emergency Medicine

## 2017-03-25 DIAGNOSIS — F17210 Nicotine dependence, cigarettes, uncomplicated: Secondary | ICD-10-CM | POA: Diagnosis not present

## 2017-03-25 DIAGNOSIS — Z79899 Other long term (current) drug therapy: Secondary | ICD-10-CM | POA: Diagnosis not present

## 2017-03-25 DIAGNOSIS — M5416 Radiculopathy, lumbar region: Secondary | ICD-10-CM | POA: Diagnosis not present

## 2017-03-25 DIAGNOSIS — Y999 Unspecified external cause status: Secondary | ICD-10-CM | POA: Diagnosis not present

## 2017-03-25 DIAGNOSIS — Y9389 Activity, other specified: Secondary | ICD-10-CM | POA: Diagnosis not present

## 2017-03-25 DIAGNOSIS — M545 Low back pain: Secondary | ICD-10-CM | POA: Diagnosis present

## 2017-03-25 DIAGNOSIS — Y92410 Unspecified street and highway as the place of occurrence of the external cause: Secondary | ICD-10-CM | POA: Diagnosis not present

## 2017-03-25 MED ORDER — HYDROCODONE-ACETAMINOPHEN 5-325 MG PO TABS: 1.0000 | ORAL_TABLET | Freq: Four times a day (QID) | ORAL | 0 refills | Status: DC | PRN

## 2017-03-25 MED ORDER — OXYCODONE-ACETAMINOPHEN 5-325 MG PO TABS
1.0000 | ORAL_TABLET | ORAL | Status: DC | PRN
  Administered 2017-03-25: 1 via ORAL

## 2017-03-25 MED ORDER — CYCLOBENZAPRINE HCL 10 MG PO TABS: 10.0000 mg | ORAL_TABLET | Freq: Two times a day (BID) | ORAL | 0 refills | Status: DC | PRN

## 2017-03-25 MED ORDER — PREDNISONE 20 MG PO TABS: 40.0000 mg | ORAL_TABLET | Freq: Every day | ORAL | 0 refills | Status: DC

## 2017-03-25 MED ORDER — OXYCODONE-ACETAMINOPHEN 5-325 MG PO TABS
ORAL_TABLET | ORAL | Status: AC
  Filled 2017-03-25: qty 1
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
## 2017-03-25 NOTE — ED Notes (Signed)
Triage called for Pt, Pt not present in the waiting area.

## 2017-03-25 NOTE — ED Notes (Signed)
Pt verbalized understanding of d/c instructions and has no further questions. Pt is stable, A&Ox4, VSS.  

## 2017-03-27 ENCOUNTER — Emergency Department (HOSPITAL_COMMUNITY)
Admission: EM | Admit: 2017-03-27 | Discharge: 2017-03-27 | Disposition: A | Discharge status: Home / Self Care | Payer: BC Managed Care – PPO | Attending: Physician Assistant | Admitting: Physician Assistant

## 2017-03-27 ENCOUNTER — Encounter (HOSPITAL_COMMUNITY): Payer: Self-pay

## 2017-03-27 DIAGNOSIS — H10022 Other mucopurulent conjunctivitis, left eye: Secondary | ICD-10-CM | POA: Insufficient documentation

## 2017-03-27 DIAGNOSIS — Z79899 Other long term (current) drug therapy: Secondary | ICD-10-CM | POA: Insufficient documentation

## 2017-03-27 DIAGNOSIS — H5712 Ocular pain, left eye: Secondary | ICD-10-CM | POA: Diagnosis present

## 2017-03-27 DIAGNOSIS — H1032 Unspecified acute conjunctivitis, left eye: Secondary | ICD-10-CM

## 2017-03-27 DIAGNOSIS — F17210 Nicotine dependence, cigarettes, uncomplicated: Secondary | ICD-10-CM | POA: Diagnosis not present

## 2017-03-27 MED ORDER — BACITRACIN-POLYMYXIN B 500-10000 UNIT/GM OP OINT
TOPICAL_OINTMENT | Freq: Four times a day (QID) | OPHTHALMIC | Status: DC
  Filled 2017-03-27: qty 3.5

## 2017-03-27 MED ORDER — CIPROFLOXACIN HCL 0.3 % OP SOLN
2.0000 [drp] | OPHTHALMIC | Status: DC
  Administered 2017-03-27: 2 [drp] via OPHTHALMIC
  Filled 2017-03-27: qty 2.5

## 2017-03-27 NOTE — Discharge Instructions (Signed)
With bacterial conjunctivitis. Please use his eye ointment every 4 hours follow awake for the next 2 days. If not improved return to follow-up with an ophthalmologist.

## 2017-03-27 NOTE — ED Provider Notes (Signed)
[facility] DEPT Provider Note   CSN: [id number] Arrival date & time: 03/27/17  1108   By signing my name below, I, Timothy Melton, attest that this documentation has been prepared under the direction and in the presence of Timothy Melton, Timothy Sorrow, MD. Electronically signed, Timothy Melton, ED Scribe. 03/27/17. 1:54 PM.   History   Chief Complaint Chief Complaint  Patient presents with  . Eye Pain   The history is provided by the patient and medical records. No language interpreter was used.    Timothy Melton is a 56 y.o. male presenting to the Emergency Department concerning L eye redness x 2-3 days. Associated eye crusting and itching. Pt also descries a sandpaper sensation to the eye. He states he obtained eye drops OTC from a pharmacy that have not provided relief. No h/o similar symptoms noted. No recent trauma/ injury noted. No visual changes or photophobia. No other complaints at this time.   Past Medical History:  Diagnosis Date  . Chronic back pain   . Hepatitis A   . Kidney stones   . Knee pain   . Memory loss   . Substance abuse     Patient Active Problem List   Diagnosis Date Noted  . Acute hepatic failure 10/03/2015  . Acute liver failure without hepatic coma   . AKI (acute kidney injury) ([place])   . General medical examination 02/12/2011  . Knee pain, left 02/12/2011  . Chronic back pain 02/12/2011  . ED (erectile dysfunction) 02/12/2011  . Kidney stones 02/12/2011  . Memory loss 02/12/2011  . Abdominal pain, acute, bilateral lower quadrant 02/12/2011    Past Surgical History:  Procedure Laterality Date  . TONSILLECTOMY         Home Medications    Prior to Admission medications   Medication Sig Start Date End Date Taking? Authorizing Provider  cefdinir (OMNICEF) 300 MG capsule Take 1 capsule (300 mg total) by mouth 2 (two) times daily. 07/24/16   Nehemiah Settle, NP  cyclobenzaprine (FLEXERIL) 10 MG tablet Take 1 tablet (10 mg total) by mouth  2 (two) times daily as needed for muscle spasms. 03/25/17   Montine Circle, PA-C  HYDROcodone-acetaminophen (NORCO/VICODIN) 5-325 MG tablet Take 1-2 tablets by mouth every 6 (six) hours as needed. 03/25/17   Montine Circle, PA-C  nicotine (NICODERM CQ - DOSED IN MG/24 HOURS) 21 mg/24hr patch Place 1 patch (21 mg total) onto the skin daily. Patient not taking: Reported on 11/02/2015 10/06/15   Mercy Riding, MD  nicotine polacrilex (NICORETTE) 2 MG gum Take 1 each (2 mg total) by mouth as needed for smoking cessation. Patient not taking: Reported on 11/02/2015 10/06/15   Mercy Riding, MD  ondansetron (ZOFRAN) 4 MG tablet Take 1 tablet (4 mg total) by mouth every 12 (twelve) hours. Patient not taking: Reported on 11/02/2015 10/06/15   Mercy Riding, MD  predniSONE (DELTASONE) 20 MG tablet Take 2 tablets (40 mg total) by mouth daily. 03/25/17   Montine Circle, PA-C  traMADol (ULTRAM) 50 MG tablet Take 1 tablet (50 mg total) by mouth every 6 (six) hours as needed. Patient not taking: Reported on 11/02/2015 10/06/15   Mercy Riding, MD    Family History Family History  Problem Relation Age of Onset  . Alcohol abuse Mother   . Stroke Father   . Heart disease Father   . Diabetes Father   . Hypertension Father     Social History Social History  Substance Use Topics  .  Smoking status: Current Every Day Smoker    Packs/day: 1.00    Types: Cigarettes  . Smokeless tobacco: Never Used  . Alcohol use Yes     Comment: occ     Allergies   Patient has no known allergies.   Review of Systems Review of Systems  Constitutional: Negative for fever.  HENT: Negative for ear discharge and ear pain.   Eyes: Positive for discharge, redness and itching. Negative for photophobia and visual disturbance.  Gastrointestinal: Negative for nausea and vomiting.  Skin: Negative for color change and wound.  Neurological: Negative for dizziness and headaches.  All other systems reviewed and are  negative.    Physical Exam Updated Vital Signs BP (!) 143/74 (BP Location: Left Arm)   Pulse 73   Temp 98.2 F (36.8 C) (Oral)   Resp 16   Ht 5\' 5"  (1.651 m)   Wt 169 lb (76.7 kg)   SpO2 98%   BMI 28.12 kg/m   Physical Exam  Constitutional: He is oriented to person, place, and time. He appears well-developed and well-nourished.  HENT:  Head: Normocephalic.  Eyes: Pupils are equal, round, and reactive to light. EOM are normal. Left conjunctiva is injected.  Mildly purulent drainage from the L eye with injected cornea. EOM intact. PERRL.  Neck: Normal range of motion.  Cardiovascular: Normal rate, regular rhythm and normal heart sounds.  Exam reveals no gallop and no friction rub.   No murmur heard. Pulmonary/Chest: Effort normal and breath sounds normal. No respiratory distress. He has no wheezes. He has no rales.  Abdominal: Soft. He exhibits no distension. There is no tenderness. There is no rebound and no guarding.  Genitourinary: Penis normal.  Musculoskeletal: Normal range of motion. He exhibits no tenderness.  Neurological: He is alert and oriented to person, place, and time.  Skin: Skin is warm. No erythema.  Psychiatric: He has a normal mood and affect.  Nursing note and vitals reviewed.    ED Treatments / Results  DIAGNOSTIC STUDIES: Oxygen Saturation is 98% on RA, NL by my interpretation.    COORDINATION OF CARE: 1:49 PM-Discussed next steps with pt. Pt verbalized understanding and is agreeable with the plan. Will Rx medication. Will provide worknote.   Labs (all labs ordered are listed, but only abnormal results are displayed) Labs Reviewed - No data to display  EKG  EKG Interpretation None       Radiology No results found.  Procedures Procedures (including critical care time)  Medications Ordered in ED Medications - No data to display   Initial Impression / Assessment and Plan / ED Course  I have reviewed the triage vital signs and the  nursing notes.  Pertinent labs & imaging results that were available during my care of the patient were reviewed by me and considered in my medical decision making (see chart for details).     I personally performed the services described in this documentation, which was scribed in my presence. The recorded information has been reviewed and is accurate.   No vision changes, normal exam except for discharge and connunctival irritaion. Likely bacterial conjunctivitis. Retuern precautions and follow up given.   Final Clinical Impressions(s) / ED Diagnoses   Final diagnoses:  None    New Prescriptions New Prescriptions   No medications on file     Macarthur Critchley, MD 03/27/17 [phone]

## 2017-03-27 NOTE — ED Triage Notes (Signed)
Pt presents for evaluation of left eye pain x 2 days. Denies injury to eye. Reports drainage and itching to eye. Denies visual changes. Pt ambulatory, AxO x4.

## 2017-04-08 ENCOUNTER — Emergency Department (HOSPITAL_COMMUNITY)
Admission: EM | Admit: 2017-04-08 | Discharge: 2017-04-08 | Disposition: A | Discharge status: Home / Self Care | Payer: BC Managed Care – PPO | Attending: Emergency Medicine | Admitting: Emergency Medicine

## 2017-04-08 ENCOUNTER — Encounter (HOSPITAL_COMMUNITY): Payer: Self-pay

## 2017-04-08 DIAGNOSIS — F17210 Nicotine dependence, cigarettes, uncomplicated: Secondary | ICD-10-CM | POA: Diagnosis not present

## 2017-04-08 DIAGNOSIS — H578 Other specified disorders of eye and adnexa: Secondary | ICD-10-CM | POA: Diagnosis present

## 2017-04-08 DIAGNOSIS — H1032 Unspecified acute conjunctivitis, left eye: Secondary | ICD-10-CM | POA: Insufficient documentation

## 2017-04-08 MED ORDER — TETRACAINE HCL 0.5 % OP SOLN
2.0000 [drp] | Freq: Once | OPHTHALMIC | Status: AC
  Administered 2017-04-08: 2 [drp] via OPHTHALMIC
  Filled 2017-04-08: qty 4

## 2017-04-08 MED ORDER — CIPROFLOXACIN HCL 0.3 % OP SOLN: 1.0000 [drp] | OPHTHALMIC | 0 refills | Status: DC

## 2017-04-08 MED ORDER — FLUORESCEIN SODIUM 0.6 MG OP STRP
1.0000 | ORAL_STRIP | Freq: Once | OPHTHALMIC | Status: AC
  Administered 2017-04-08: 1 via OPHTHALMIC
  Filled 2017-04-08: qty 1

## 2017-04-08 NOTE — Discharge Instructions (Signed)
Please follow up with your PCP for persistent symptoms.

## 2017-04-08 NOTE — ED Notes (Signed)
Declined W/C at D/C and was escorted to lobby by RN. 

## 2017-04-08 NOTE — ED Triage Notes (Signed)
Patient complains of recurrent left eye redness and drainage after being treated for pink eye 2 weeks ago. NAD

## 2017-04-08 NOTE — ED Triage Notes (Signed)
PT called in front x2 no answer.

## 2017-04-08 NOTE — ED Provider Notes (Signed)
[facility] DEPT Provider Note   CSN: [id number] Arrival date & time: 04/08/17  1403     History   Chief Complaint Chief Complaint  Patient presents with  . Conjunctivitis    HPI Timothy Melton is a 56 y.o. male who presents to the emergency department for left eye redness and drainage 3 days. Patient states that he was recently seen in the emergency department on 03/27/2017 and diagnosed with pinkeye. He was given antibiotics to use at home. He states that he used these for 2 days but his symptoms started improving so he did not finish the course of medication. The symptoms returned 3 days ago in which the patient is now having eye crusting, clear/mucopurulent discharge, redness and sandpaper sensation to the eye. He is no relief with over-the-counter pharmacy. He has not started using the rest of the atraumatic onset her left because he lost them at this time. No trauma noted. No visual changes or photophobia. He is not a contact lens wearer No other complaints at this time.    HPI  Past Medical History:  Diagnosis Date  . Chronic back pain   . Hepatitis A   . Kidney stones   . Knee pain   . Memory loss   . Substance abuse     Patient Active Problem List   Diagnosis Date Noted  . Acute hepatic failure 10/03/2015  . Acute liver failure without hepatic coma   . AKI (acute kidney injury) ([place])   . General medical examination 02/12/2011  . Knee pain, left 02/12/2011  . Chronic back pain 02/12/2011  . ED (erectile dysfunction) 02/12/2011  . Kidney stones 02/12/2011  . Memory loss 02/12/2011  . Abdominal pain, acute, bilateral lower quadrant 02/12/2011    Past Surgical History:  Procedure Laterality Date  . TONSILLECTOMY         Home Medications    Prior to Admission medications   Medication Sig Start Date End Date Taking? Authorizing Provider  cefdinir (OMNICEF) 300 MG capsule Take 1 capsule (300 mg total) by mouth 2 (two) times daily. 07/24/16   Nehemiah Settle, NP  ciprofloxacin (CILOXAN) 0.3 % ophthalmic solution Place 1 drop into the left eye every 2 (two) hours. 1 gtt in the eye Q2 hrs while awake x 2 days, then 1 gtt Q4 hrs while awake x 5 days 04/08/17   Duston Smolenski, Barth Kirks, PA-C  cyclobenzaprine (FLEXERIL) 10 MG tablet Take 1 tablet (10 mg total) by mouth 2 (two) times daily as needed for muscle spasms. 03/25/17   Montine Circle, PA-C  HYDROcodone-acetaminophen (NORCO/VICODIN) 5-325 MG tablet Take 1-2 tablets by mouth every 6 (six) hours as needed. 03/25/17   Montine Circle, PA-C  nicotine (NICODERM CQ - DOSED IN MG/24 HOURS) 21 mg/24hr patch Place 1 patch (21 mg total) onto the skin daily. Patient not taking: Reported on 11/02/2015 10/06/15   Mercy Riding, MD  nicotine polacrilex (NICORETTE) 2 MG gum Take 1 each (2 mg total) by mouth as needed for smoking cessation. Patient not taking: Reported on 11/02/2015 10/06/15   Mercy Riding, MD  ondansetron (ZOFRAN) 4 MG tablet Take 1 tablet (4 mg total) by mouth every 12 (twelve) hours. Patient not taking: Reported on 11/02/2015 10/06/15   Mercy Riding, MD  predniSONE (DELTASONE) 20 MG tablet Take 2 tablets (40 mg total) by mouth daily. 03/25/17   Montine Circle, PA-C  traMADol (ULTRAM) 50 MG tablet Take 1 tablet (50 mg total) by mouth every 6 (  six) hours as needed. Patient not taking: Reported on 11/02/2015 10/06/15   Mercy Riding, MD    Family History Family History  Problem Relation Age of Onset  . Alcohol abuse Mother   . Stroke Father   . Heart disease Father   . Diabetes Father   . Hypertension Father     Social History Social History  Substance Use Topics  . Smoking status: Current Every Day Smoker    Packs/day: 1.00    Types: Cigarettes  . Smokeless tobacco: Never Used  . Alcohol use Yes     Comment: occ     Allergies   Patient has no known allergies.   Review of Systems Review of Systems  Constitutional: Negative for chills and fever.  HENT: Negative for ear pain,  sinus pain and sore throat.   Eyes: Positive for pain, discharge, redness and itching. Negative for photophobia and visual disturbance.     Physical Exam Updated Vital Signs BP 125/74 (BP Location: Right Arm)   Pulse 81   Temp 98.4 F (36.9 C) (Oral)   Resp 16   Ht 5\' 6"  (1.676 m)   Wt 75.8 kg (167 lb)   SpO2 98%   BMI 26.95 kg/m   Physical Exam  Constitutional: He appears well-developed and well-nourished.  HENT:  Head: Normocephalic and atraumatic.  Right Ear: Hearing, tympanic membrane, external ear and ear canal normal.  Left Ear: Hearing, tympanic membrane, external ear and ear canal normal.  Nose: Nose normal. Right sinus exhibits no maxillary sinus tenderness and no frontal sinus tenderness. Left sinus exhibits no maxillary sinus tenderness and no frontal sinus tenderness.  Mouth/Throat: Uvula is midline, oropharynx is clear and moist and mucous membranes are normal.  Eyes: Pupils are equal, round, and reactive to light. EOM and lids are normal. Right eye exhibits no discharge. Left eye exhibits discharge (clear). Right conjunctiva is not injected. Right conjunctiva has no hemorrhage. Left conjunctiva is injected. Left conjunctiva has no hemorrhage. No scleral icterus.  Fluorescein stain without uptake. Negative Seidel sign. Tonometry with left eye pressure of 18 mmHg.  Pulmonary/Chest: Effort normal. No respiratory distress.  Lymphadenopathy:       Head (right side): No preauricular adenopathy present.       Head (left side): No preauricular adenopathy present.    He has no cervical adenopathy.  Neurological: He is alert.  Skin: No pallor.  Psychiatric: He has a normal mood and affect.  Nursing note and vitals reviewed.    ED Treatments / Results  Labs (all labs ordered are listed, but only abnormal results are displayed) Labs Reviewed - No data to display  EKG  EKG Interpretation None       Radiology No results found.  Procedures Procedures (including  critical care time)  Medications Ordered in ED Medications  fluorescein ophthalmic strip 1 strip (1 strip Left Eye Given 04/08/17 1711)  tetracaine (PONTOCAINE) 0.5 % ophthalmic solution 2 drop (2 drops Left Eye Given 04/08/17 1711)     Initial Impression / Assessment and Plan / ED Course  I have reviewed the triage vital signs and the nursing notes.  Pertinent labs & imaging results that were available during my care of the patient were reviewed by me and considered in my medical decision making (see chart for details).     56 year old male with left eye redness. Believe this is treatment failure for conjunctivitis as the patient only took 2 days of therapy after last visit. No evidence of  HSV or VSV infection. Pt is not a contact lens wearer.  Exam non-concerning for orbital cellulitis, hyphema, corneal ulcers, corneal abrasions or trauma.  Patient be discharged home with antibiotic drops.  Patient has been instructed to use cool compresses and practice personal hygiene with frequent hand washing.  Patient understands to follow up with ophthalmology, especially if new symptoms including change in vision, purulent drainage, or entrapment occur.    Final Clinical Impressions(s) / ED Diagnoses   Final diagnoses:  Acute conjunctivitis of left eye, unspecified acute conjunctivitis type    New Prescriptions Discharge Medication List as of 04/08/2017  5:32 PM    START taking these medications   Details  ciprofloxacin (CILOXAN) 0.3 % ophthalmic solution Place 1 drop into the left eye every 2 (two) hours. 1 gtt in the eye Q2 hrs while awake x 2 days, then 1 gtt Q4 hrs while awake x 5 days, Starting Sat 04/08/2017, Print         Jillyn Ledger, PA-C 04/08/17 1752    Carmin Muskrat, MD 04/09/17 [phone]

## 2017-04-17 ENCOUNTER — Institutional Professional Consult (permissible substitution): Payer: Self-pay | Admitting: Medical

## 2017-05-10 ENCOUNTER — Institutional Professional Consult (permissible substitution): Payer: Self-pay | Admitting: Medical

## 2017-08-18 ENCOUNTER — Ambulatory Visit (INDEPENDENT_AMBULATORY_CARE_PROVIDER_SITE_OTHER): Payer: BC Managed Care – PPO | Admitting: Medical

## 2017-08-18 ENCOUNTER — Encounter: Payer: Self-pay | Admitting: Medical

## 2017-08-18 VITALS — BP 126/78 | HR 76 | Ht 65.0 in | Wt 165.6 lb

## 2017-08-18 DIAGNOSIS — N529 Male erectile dysfunction, unspecified: Secondary | ICD-10-CM

## 2017-08-18 DIAGNOSIS — R3911 Hesitancy of micturition: Secondary | ICD-10-CM

## 2017-08-18 DIAGNOSIS — Z0000 Encounter for general adult medical examination without abnormal findings: Secondary | ICD-10-CM

## 2017-08-18 DIAGNOSIS — N401 Enlarged prostate with lower urinary tract symptoms: Secondary | ICD-10-CM

## 2017-08-18 DIAGNOSIS — Z125 Encounter for screening for malignant neoplasm of prostate: Secondary | ICD-10-CM

## 2017-08-18 DIAGNOSIS — L84 Corns and callosities: Secondary | ICD-10-CM

## 2017-08-18 DIAGNOSIS — Z1211 Encounter for screening for malignant neoplasm of colon: Secondary | ICD-10-CM

## 2017-08-18 DIAGNOSIS — F17200 Nicotine dependence, unspecified, uncomplicated: Secondary | ICD-10-CM | POA: Diagnosis not present

## 2017-08-18 DIAGNOSIS — R202 Paresthesia of skin: Secondary | ICD-10-CM

## 2017-08-18 DIAGNOSIS — Z0289 Encounter for other administrative examinations: Secondary | ICD-10-CM | POA: Insufficient documentation

## 2017-08-18 NOTE — Patient Instructions (Signed)
Recommendations: See your eye doctor yearly for routine vision care. See your dentist yearly for routine dental care including hygiene visits twice yearly.  Local eye doctor: Fabio Pierce, M.D. Corena Herter, O.D. [place], [place], [place] [address] Medical telephone: [phone] Optical telephone: [phone]   Local dentist: Dr. Jonna Coup, dentist [address] [place], [place] [address] [phone] [email or web address]   Your prostate is quite enlarged.  Pending labs, I will make some recommendations  I recommend you work to quit tobacco!  We will refer you for colonoscopy.   Benign Prostatic Hyperplasia Benign prostatic hyperplasia is when the prostate gland is bigger than normal (enlarged). The prostate is a gland that produces the fluid that goes into semen. It is near the opening to the bladder and it surrounds the tube that drains urine out of the body (urethra). Benign prostatic hyperplasia is common among older men and it typically causes problems with urinating. The prostate grows slowly as you age. As the prostate grows, it can pinch the urethra. This causes the bladder to work too hard to pass urine, which leads to a thickened bladder wall. The bladder may eventually become weak and unable to empty completely. What are the causes? The exact cause of this condition is not known. It may be related to changes in hormones as the body ages. What increases the risk? You are more likely to develop this condition if:  You have a family history of the condition.  You are age 27 or older.  You have a history of erectile dysfunction.  You do not exercise.  You have certain medical conditions, including: ? Type 2 diabetes. ? Obesity. ? Heart and circulatory disease.  What are the signs or symptoms? Symptoms of this condition include:  Weak or interrupted urine stream.  Dribbling or leaking urine.  Feeling like the bladder has not emptied  completely.  Difficulty starting urination.  Getting up frequently at night to urinate.  Urinating more often (8 or more times a day).  Accidental loss of urine (urinary incontinence).  Pain during urination or ejaculation.  Urine with an unusual smell or color.  The size of the prostate does not always determine the severity of the symptoms. For example, a man with a large prostate may experience minor symptoms, or a man with a smaller prostate may experience a severe blockage. How is this diagnosed? This condition may be diagnosed based on:  Your medical history and symptoms.  A physical exam. This usually includes a digital rectal exam. During this exam, your health care provider places a gloved, lubricated finger into the rectum to feel the size of the prostate.  A blood test. This test checks for high levels of a protein that is produced by the prostate (prostate specific antigen, PSA).  Tests to examine how well the urethra and bladder are functioning (urodynamic tests).  Cystoscopy. For this test, a small, tube-shaped instrument (cystoscope) is used to look inside the urethra and bladder. The cystoscope is placed into the urinary tract through the opening at the tip of the penis.  Urine tests.  Ultrasound.  How is this treated? Treatment for this condition depends on how severe your symptoms are. Treatment may include:  Active surveillance or "watchful waiting." If your symptoms are mild, your health care provider may delay treatment and ask you to keep track of your symptoms. You will have regular checkups to examine the size of your prostate, discuss symptoms, and determine whether  treatment is needed.  Medicines. These may be used to: ? Stop prostate growth. ? Shrink the prostate. ? Relieve symptoms.  Lifestyle changes, including: ? Pelvic floor muscle exercises. The pelvic floor muscles are a group of muscles that relax when you urinate. ? Bladder training. This  involves exercises that train the bladder to hold more urine for longer periods. ? Reducing the amount of liquid that you drink. This is especially important before sleeping and before long periods of time spent in public. ? Reducing the amount of caffeine and alcohol that you drink. ? Treating or preventing constipation.  Surgery to reduce the size of the prostate or widen the urethra. This is typically done if your symptoms are severe or there are serious complications from the enlarged prostate.  Follow these instructions at home: Medicines  Take over-the-counter and prescription medicines as told by your health care provider.  Avoid certain medicines, such as decongestants, antihistamines, and some prescription medicines as told by your health care provider. Ask your health care provider which medicines you should avoid. General instructions  Monitor your symptoms for any changes. Tell your health care provider about any changes.  Give yourself time when you urinate.  Avoid certain beverages that can irritate the bladder, such as: ? Alcohol. ? Caffeinated drinks like coffee, tea, and cola.  Avoid drinking large amounts of liquid before bed or before going out in public.  Do pelvic floor muscle or bladder training exercises as told by your health care provider.  Keep all follow-up visits as told by your health care provider. This is important. Contact a health care provider if:  Your develop new or worse symptoms.  You have trouble getting or maintaining an erection.  You have a fever.  You have pain or burning during urination.  You have blood in your urine. Get help right away if:  You have severe pain when urinating.  You cannot urinate.  You have severe pain in your abdomen.  You are dizzy.  You faint.  You have severe back pain.  Your urine is dark red and difficult to see through.  You have large blood clots in your urine.  You have severe pain after  an erection.  You have chest pain, dizziness, or nausea during sexual activity. Summary  The prostate is a gland that produces the fluid that goes into semen. It is near the opening to the bladder and it surrounds the tube that drains urine out of the body (urethra).  Benign prostatic hyperplasia is common among older men and it typically causes problems with urinating.  If your symptoms are mild, your health care provider may delay treatment and ask you to keep track of your symptoms. You will have regular checkups to examine the size of your prostate, discuss symptoms, and determine whether treatment is needed.  If directed, you may need to avoid certain medicines, such as decongestants, antihistamines, and some prescription medicines.  Contact your health care provider if you develop new or worse symptoms. This information is not intended to replace advice given to you by your health care provider. Make sure you discuss any questions you have with your health care provider. Document Released: 08/22/2005 Document Revised: 07/11/2016 Document Reviewed: 07/11/2016 Elsevier Interactive Patient Education  2017 [place] are small areas of thickened skin that occur on the top, sides, or tip of a toe. They contain a cone-shaped core with a point that can press on  a nerve below. This causes pain. Calluses are areas of thickened skin that can occur anywhere on the body including hands, fingers, palms, soles of the feet, and heels.Calluses are usually larger than corns. What are the causes? Corns and calluses are caused by rubbing (friction) or pressure, such as from shoes that are too tight or do not fit properly. What increases the risk? Corns are more likely to develop in people who have toe deformities, such as hammer toes. Since calluses can occur with friction to any area of the skin, calluses are more likely to develop in people who:  Work with their  hands.  Wear shoes that fit poorly, shoes that are too tight, or shoes that are high-heeled.  Have toes deformities.  What are the signs or symptoms? Symptoms of a corn or callus include:  A hard growth on the skin.  Pain or tenderness under the skin.  Redness and swelling.  Increased discomfort while wearing tight-fitting shoes.  How is this diagnosed? Corns and calluses may be diagnosed with a medical history and physical exam. How is this treated? Corns and calluses may be treated with:  Removing the cause of the friction or pressure. This may include: ? Changing your shoes. ? Wearing shoe inserts (orthotics) or other protective layers in your shoes, such as a corn pad. ? Wearing gloves.  Medicines to help soften skin in the hardened, thickened areas.  Reducing the size of the corn or callus by removing the dead layers of skin.  Antibiotic medicines to treat infection.  Surgery, if a toe deformity is the cause.  Follow these instructions at home:  Take medicines only as directed by your health care provider.  If you were prescribed an antibiotic, finish all of it even if you start to feel better.  Wear shoes that fit well. Avoid wearing high-heeled shoes and shoes that are too tight or too loose.  Wear any padding, protective layers, gloves, or orthotics as directed by your health care provider.  Soak your hands or feet and then use a file or pumice stone to soften your corn or callus. Do this as directed by your health care provider.  Check your corn or callus every day for signs of infection. Watch for: ? Redness, swelling, or pain. ? Fluid, blood, or pus. Contact a health care provider if:  Your symptoms do not improve with treatment.  You have increased redness, swelling, or pain at the site of your corn or callus.  You have fluid, blood, or pus coming from your corn or callus.  You have new symptoms. This information is not intended to replace advice  given to you by your health care provider. Make sure you discuss any questions you have with your health care provider. Document Released: 05/28/2004 Document Revised: 03/11/2016 Document Reviewed: 08/18/2014 Elsevier Interactive Patient Education  [employer].
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
## 2017-08-19 LAB — CBC
HEMATOCRIT: 48.1 % (ref 38.5–50.0)
Hemoglobin: 16.4 g/dL (ref 13.2–17.1)
MCH: 30 pg (ref 27.0–33.0)
MCHC: 34.1 g/dL (ref 32.0–36.0)
MCV: 87.9 fL (ref 80.0–100.0)
MPV: 9.7 fL (ref 7.5–12.5)
Platelets: 260 10*3/uL (ref 140–400)
RBC: 5.47 10*6/uL (ref 4.20–5.80)
RDW: 13.9 % (ref 11.0–15.0)
WBC: 5.3 10*3/uL (ref 3.8–10.8)

## 2017-08-19 LAB — COMPREHENSIVE METABOLIC PANEL
AG Ratio: 1.4 (calc) (ref 1.0–2.5)
ALT: 16 U/L (ref 9–46)
AST: 25 U/L (ref 10–35)
Albumin: 4.2 g/dL (ref 3.6–5.1)
Alkaline phosphatase (APISO): 59 U/L (ref 40–115)
BILIRUBIN TOTAL: 0.4 mg/dL (ref 0.2–1.2)
BUN: 14 mg/dL (ref 7–25)
CALCIUM: 9.2 mg/dL (ref 8.6–10.3)
CO2: 29 mmol/L (ref 20–32)
CREATININE: 1.06 mg/dL (ref 0.70–1.33)
Chloride: 101 mmol/L (ref 98–110)
Globulin: 2.9 g/dL (calc) (ref 1.9–3.7)
Glucose, Bld: 86 mg/dL (ref 65–99)
POTASSIUM: 4.4 mmol/L (ref 3.5–5.3)
SODIUM: 138 mmol/L (ref 135–146)
Total Protein: 7.1 g/dL (ref 6.1–8.1)

## 2017-08-19 LAB — HEPATITIS C ANTIBODY
HEP C AB: NONREACTIVE
SIGNAL TO CUT-OFF: 0.13 (ref <1.00)

## 2017-08-19 LAB — HEMOGLOBIN A1C
Hgb A1c MFr Bld: 5.4 % of total Hgb (ref <5.7)
Mean Plasma Glucose: 108 (calc)
eAG (mmol/L): 6 (calc)

## 2017-08-19 LAB — LIPID PANEL
CHOL/HDL RATIO: 4.3 (calc) (ref <5.0)
Cholesterol: 291 mg/dL — ABNORMAL HIGH (ref <200)
HDL: 67 mg/dL (ref >40)
LDL CHOLESTEROL (CALC): 188 mg/dL — AB
Non-HDL Cholesterol (Calc): 224 mg/dL (calc) — ABNORMAL HIGH (ref <130)
Triglycerides: 182 mg/dL — ABNORMAL HIGH (ref <150)

## 2017-08-19 LAB — PSA: PSA: 1.9 ng/mL (ref <=4.0)

## 2017-08-21 ENCOUNTER — Other Ambulatory Visit: Payer: Self-pay | Admitting: Medical

## 2017-08-21 ENCOUNTER — Encounter: Payer: Self-pay | Admitting: Medical

## 2017-08-21 MED ORDER — SILDENAFIL CITRATE 100 MG PO TABS
100.0000 mg | ORAL_TABLET | ORAL | 0 refills | Status: DC | PRN
Start: 2017-08-21 — End: 2017-10-06

## 2017-08-21 MED ORDER — ASPIRIN EC 81 MG PO TBEC: 81.0000 mg | DELAYED_RELEASE_TABLET | Freq: Every day | ORAL | 3 refills | Status: DC

## 2017-08-21 MED ORDER — TAMSULOSIN HCL 0.4 MG PO CAPS: 0.4000 mg | ORAL_CAPSULE | Freq: Every day | ORAL | 2 refills | Status: DC

## 2017-08-21 MED ORDER — PRAVASTATIN SODIUM 20 MG PO TABS: 20.0000 mg | ORAL_TABLET | Freq: Every evening | ORAL | 0 refills | Status: DC

## 2017-08-24 ENCOUNTER — Telehealth: Payer: Self-pay | Admitting: Medical

## 2017-08-24 NOTE — Telephone Encounter (Signed)
received vaccines from employee health put in your folder

## 2017-09-25 ENCOUNTER — Telehealth: Payer: Self-pay | Admitting: Medical

## 2017-09-25 ENCOUNTER — Encounter: Payer: Self-pay | Admitting: Medical

## 2017-09-25 NOTE — Telephone Encounter (Signed)
Okay 

## 2017-09-25 NOTE — Telephone Encounter (Signed)
Please call patient and inquire.  I received notification from [facility] that he either no showed or wasn't able to be contacted for their appointment in which we referred them.   Please make sure they are in agreement for the referral, and make sure they are aware that when we refer to a specialist, no showing or not being able to be reached can result in the specialist not taking them as a patient, make cause them to incur no show fees, and is just not appropriate in general to [place] or them.  Please either have them call back to set up the appointment or document their reply.

## 2017-10-05 ENCOUNTER — Other Ambulatory Visit: Payer: Self-pay | Admitting: Medical

## 2017-10-05 ENCOUNTER — Other Ambulatory Visit: Payer: BC Managed Care – PPO

## 2017-10-05 DIAGNOSIS — E785 Hyperlipidemia, unspecified: Secondary | ICD-10-CM

## 2017-10-05 DIAGNOSIS — Z79899 Other long term (current) drug therapy: Secondary | ICD-10-CM

## 2017-10-06 ENCOUNTER — Ambulatory Visit: Payer: BC Managed Care – PPO | Admitting: Medical

## 2017-10-06 VITALS — BP 120/82 | HR 92 | Temp 98.2°F | Wt 170.4 lb

## 2017-10-06 DIAGNOSIS — E785 Hyperlipidemia, unspecified: Secondary | ICD-10-CM

## 2017-10-06 DIAGNOSIS — R3911 Hesitancy of micturition: Secondary | ICD-10-CM | POA: Diagnosis not present

## 2017-10-06 DIAGNOSIS — N529 Male erectile dysfunction, unspecified: Secondary | ICD-10-CM

## 2017-10-06 DIAGNOSIS — N401 Enlarged prostate with lower urinary tract symptoms: Secondary | ICD-10-CM

## 2017-10-06 DIAGNOSIS — F17200 Nicotine dependence, unspecified, uncomplicated: Secondary | ICD-10-CM | POA: Diagnosis not present

## 2017-10-06 DIAGNOSIS — Z1211 Encounter for screening for malignant neoplasm of colon: Secondary | ICD-10-CM | POA: Diagnosis not present

## 2017-10-06 DIAGNOSIS — H9203 Otalgia, bilateral: Secondary | ICD-10-CM

## 2017-10-06 DIAGNOSIS — H6123 Impacted cerumen, bilateral: Secondary | ICD-10-CM

## 2017-10-06 LAB — LIPID PANEL
CHOLESTEROL TOTAL: 262 mg/dL — AB (ref 100–199)
Chol/HDL Ratio: 4.4 ratio (ref 0.0–5.0)
HDL: 60 mg/dL (ref >39)
LDL Calculated: 163 mg/dL — ABNORMAL HIGH (ref 0–99)
TRIGLYCERIDES: 193 mg/dL — AB (ref 0–149)
VLDL CHOLESTEROL CAL: 39 mg/dL (ref 5–40)

## 2017-10-06 LAB — ALT: ALT: 15 IU/L (ref 0–44)

## 2017-10-06 MED ORDER — SILDENAFIL CITRATE 20 MG PO TABS: ORAL_TABLET | ORAL | 2 refills | Status: DC

## 2017-10-06 NOTE — Patient Instructions (Signed)
Call the following about your colonoscopy  Santina Evans [phone]

## 2017-10-06 NOTE — Progress Notes (Signed)
Subjective: Chief Complaint  Patient presents with  . follow-up    follow-up on labs and meds  . Ear Pain    right ear pain- a week but feels like pain is moving towards other ear   Here for follow-up from his recent physical to discuss labs and findings.    At his last visit his cholesterol was found to be quite high.  He was started on Pravachol and aspirin but he says he has not been taking this regularly.  He has questions about diet recommendations for this.  He was supposed to have a recheck on labs today  He continues to smoke regularly  Last visit we prescribed Viagra for erectile dysfunction but it is way too expensive so he has not tried this.  Last visit we discussed BPH symptoms and enlarged prostate on exam.  He has been on Flomax and has seen improvement with this.   He reports some pressure and pain in his ears for the last week or so, no drainage, no fever, no sore throat.  Past Medical History:  Diagnosis Date  . Chronic back pain   . Full dentures   . Hepatitis A 2016   Hep A infection?  [place], [place]  . Kidney stones   . Knee pain   . Memory loss   . Substance abuse ([place])    Current Outpatient Medications on File Prior to Visit  Medication Sig Dispense Refill  . aspirin EC 81 MG tablet Take 1 tablet (81 mg total) by mouth daily. 90 tablet 3  . pravastatin (PRAVACHOL) 20 MG tablet Take 1 tablet (20 mg total) by mouth every evening. 90 tablet 0  . tamsulosin (FLOMAX) 0.4 MG CAPS capsule Take 1 capsule (0.4 mg total) by mouth daily after supper. 30 capsule 2   No current facility-administered medications on file prior to visit.    ROS as in subjective   Objective: BP 120/82   Pulse 92   Temp 98.2 F (36.8 C) (Oral)   Wt 170 lb 6.4 oz (77.3 kg)   BMI 28.36 kg/m   General appearance: alert, no distress, WD/WN,  HEENT: normocephalic, sclerae anicteric, impacted cerumen bilat, nares patent, no discharge or erythema, pharynx normal Oral cavity: MMM, no  lesions Neck: supple, no lymphadenopathy, no thyromegaly, no masses    Assessment: Encounter Diagnoses  Name Primary?  . Hyperlipidemia, unspecified hyperlipidemia type Yes  . Benign prostatic hyperplasia with urinary hesitancy   . Erectile dysfunction, unspecified erectile dysfunction type   . Smoker   . Otalgia of both ears   . Impacted cerumen of both ears   . Screen for colon cancer      Plan: Hyperlipidemia-discussed his lab results, diet, advised the make diet modifications, continue regular exercise, and strongly advised he quit smoking.  At advised he be compliant with statin and aspirin every night.  Plan to recheck fasting labs in 2 months  Smoking-counseled on risk of smoking and strongly recommend he find ways to quit smoking.  Counseled on ways to help stop smoking.  He will consider  Referral for colon cancer screening-from last visit the gastroenterology office tried to call him but he did not respond to their calls.  I gave him the number and advised he call them  Erectile dysfunction-begin trial of generic sildenafil since name brand Viagra was too expensive.  Discussed risk and benefits and proper use of medication.  BPH - improved on flomax.  C/t same medication  Discussed findings.  Discussed risk/benefits of procedure and patient agrees to procedure. Successfully used warm water lavage to remove impacted cerumen from bilat ear canal. Patient tolerated procedure well. Advised they avoid using any cotton swabs or other devices to clean the ear canals.  Use basic hygiene as discussed.  Follow up prn.   Timothy Melton was seen today for follow-up and ear pain.  Diagnoses and all orders for this visit:  Hyperlipidemia, unspecified hyperlipidemia type  Benign prostatic hyperplasia with urinary hesitancy  Erectile dysfunction, unspecified erectile dysfunction type  Smoker  Otalgia of both ears  Impacted cerumen of both ears  Screen for colon cancer  Other  orders -     sildenafil (REVATIO) 20 MG tablet; 1-5 tablets (20 mg to 100 mg) prior to sexual activity daily prn  Spent > 45 minutes face to face with patient in discussion of symptoms, evaluation, plan and recommendations.

## 2017-10-15 ENCOUNTER — Telehealth: Payer: Self-pay | Admitting: Medical

## 2017-10-15 ENCOUNTER — Other Ambulatory Visit: Payer: Self-pay | Admitting: Medical

## 2017-10-15 NOTE — Telephone Encounter (Signed)
P.A. SILDENAFIL  

## 2017-11-02 NOTE — Telephone Encounter (Signed)
Called [facility] t# [phone] and shows no active coverage for pt.  Called and left message for pt

## 2017-11-06 NOTE — Telephone Encounter (Signed)
Left another message for pt.

## 2017-11-23 ENCOUNTER — Other Ambulatory Visit: Payer: Self-pay | Admitting: Medical

## 2017-12-07 ENCOUNTER — Ambulatory Visit: Payer: BC Managed Care – PPO | Admitting: Medical

## 2017-12-07 ENCOUNTER — Encounter: Payer: Self-pay | Admitting: Medical

## 2017-12-07 VITALS — BP 124/84 | HR 79 | Ht 65.0 in | Wt 169.4 lb

## 2017-12-07 DIAGNOSIS — F17200 Nicotine dependence, unspecified, uncomplicated: Secondary | ICD-10-CM

## 2017-12-07 DIAGNOSIS — N529 Male erectile dysfunction, unspecified: Secondary | ICD-10-CM

## 2017-12-07 DIAGNOSIS — E785 Hyperlipidemia, unspecified: Secondary | ICD-10-CM

## 2017-12-07 DIAGNOSIS — S161XXA Strain of muscle, fascia and tendon at neck level, initial encounter: Secondary | ICD-10-CM

## 2017-12-07 DIAGNOSIS — M62838 Other muscle spasm: Secondary | ICD-10-CM

## 2017-12-07 MED ORDER — CYCLOBENZAPRINE HCL 10 MG PO TABS: ORAL_TABLET | ORAL | 0 refills | Status: DC

## 2017-12-07 NOTE — Patient Instructions (Addendum)
Erectile Dysfunction Please call your insurer and ask what is their "preferred" medication for erectile dysfunction:  Viagra  Cialis  Levitra  Stendra  Staxyn  There is also generic Viagra called Sildenafil  If you learn that your insurer covers none of the medications above, you may have to pay out of pocket for one of these.  In that case, we normally use generic Sildenafil  Keep in mind, some patient send their prescription to certain [place] pharmacies for cheaper options.   Colonoscopy Please call [facility] to reschedule for colonoscopy consult [phone]

## 2017-12-07 NOTE — Progress Notes (Signed)
Subjective: Chief Complaint  Patient presents with  . Follow-up    back neck pain, spasm, colonscopy referral    Here for f/u on back and neck issues.   We had referred for colonoscopy.  He had to cancel.  Needs appt to reschedule.  Erectile dysfunction - has tried Viagra prior that worked well.   Insurance wouldn't cover generic sildenafil.    Wants to try something else.    BPH symptoms improved on Flomax, but he wants to try without the medication for a while though.  He has made diet changes, and taking the Pravachol some.   Wants to recheck fasting lipid today.   Having ongoing neck spasm upper back, stabbing pain at times.   Wonders about his posture.   Stretches some.  Had cut back on exercise recently due to working 2 jobs.   Past Medical History:  Diagnosis Date  . Chronic back pain   . Full dentures   . Hepatitis A 2016   Hep A infection?  [place], [place]  . Kidney stones   . Knee pain   . Memory loss   . Substance abuse ([place])    Current Outpatient Medications on File Prior to Visit  Medication Sig Dispense Refill  . aspirin EC 81 MG tablet Take 1 tablet (81 mg total) by mouth daily. 90 tablet 3  . pravastatin (PRAVACHOL) 20 MG tablet TAKE 1 TABLET BY MOUTH EVERY DAY IN THE EVENING (Patient not taking: Reported on 12/07/2017) 90 tablet 0  . sildenafil (REVATIO) 20 MG tablet 1-5 tablets (20 mg to 100 mg) prior to sexual activity daily prn (Patient not taking: Reported on 12/07/2017) 50 tablet 2  . tamsulosin (FLOMAX) 0.4 MG CAPS capsule TAKE 1 CAPSULE (0.4 MG TOTAL) BY MOUTH DAILY AFTER SUPPER. (Patient not taking: Reported on 12/07/2017) 30 capsule 0   No current facility-administered medications on file prior to visit.    ROS as in subjective   Objective: BP 124/84 (BP Location: Right Arm, Patient Position: Sitting, Cuff Size: Normal)   Pulse 79   Ht 5\' 5"  (1.651 m)   Wt 169 lb 6.4 oz (76.8 kg)   SpO2 94%   BMI 28.19 kg/m   Gen:wd, wn, nad Skin:  unremarkable Tender left trapezius region, upper paraspinal region, tender over posterior neck over C7 provenience, but otherwise neck and back nontneder with normal ROM    Assessment: Encounter Diagnoses  Name Primary?  . Hyperlipidemia, unspecified hyperlipidemia type Yes  . Smoker   . Erectile dysfunction, unspecified erectile dysfunction type   . Neck muscle spasm   . Strain of neck muscle, initial encounter      Plan: hyperlipidemia - repeat lab today, advised he c/t Pravachol given smoker and age risk factors  Smoker - advised cessation.  He is not ready to quit  ED - he will call insurer about coverage first.  Gave option for therapy.  Neck strain and spasm - advised stretching, can use heat and massage for acute flare, flexeril QHS prn for spasm, caution with sedation, NSAID OTC prn.  Newt was seen today for follow-up.  Diagnoses and all orders for this visit:  Hyperlipidemia, unspecified hyperlipidemia type -     Lipid panel  Smoker -     Lipid panel  Erectile dysfunction, unspecified erectile dysfunction type  Neck muscle spasm  Strain of neck muscle, initial encounter  Other orders -     cyclobenzaprine (FLEXERIL) 10 MG tablet; 1/2-1 tablet po QHS prn for  spasm

## 2017-12-08 ENCOUNTER — Telehealth: Payer: Self-pay

## 2017-12-08 ENCOUNTER — Other Ambulatory Visit: Payer: Self-pay | Admitting: Medical

## 2017-12-08 LAB — LIPID PANEL
CHOLESTEROL TOTAL: 260 mg/dL — AB (ref 100–199)
Chol/HDL Ratio: 4.6 ratio (ref 0.0–5.0)
HDL: 57 mg/dL (ref >39)
LDL CALC: 160 mg/dL — AB (ref 0–99)
Triglycerides: 215 mg/dL — ABNORMAL HIGH (ref 0–149)
VLDL CHOLESTEROL CAL: 43 mg/dL — AB (ref 5–40)

## 2017-12-08 MED ORDER — PRAVASTATIN SODIUM 20 MG PO TABS: ORAL_TABLET | ORAL | 3 refills | Status: DC

## 2017-12-08 NOTE — Telephone Encounter (Signed)
-----   Message from Carlena Hurl, PA-C sent at 12/08/2017  7:49 AM EDT ----- Cholesterol unchanged.   I again recommend he stay on cholesterol medication for the foreseeable future to lower heart disease risk

## 2017-12-08 NOTE — Telephone Encounter (Signed)
Called patient, LVM to call back to discuss lab work. Left call back number.  

## 2017-12-12 ENCOUNTER — Telehealth: Payer: Self-pay | Admitting: Family Medicine

## 2017-12-12 NOTE — Telephone Encounter (Signed)
Patient called for lab results, gave him same.  Will email him copy of labs and diet info. To [email or web address]

## 2018-01-01 ENCOUNTER — Encounter: Payer: Self-pay | Admitting: Internal Medicine

## 2018-01-03 DIAGNOSIS — F1910 Other psychoactive substance abuse, uncomplicated: Secondary | ICD-10-CM

## 2018-01-03 HISTORY — DX: Other psychoactive substance abuse, uncomplicated: F19.10

## 2018-01-04 ENCOUNTER — Ambulatory Visit: Payer: BC Managed Care – PPO | Admitting: Medical

## 2018-01-04 ENCOUNTER — Encounter: Payer: Self-pay | Admitting: Medical

## 2018-01-04 VITALS — BP 140/86 | HR 91 | Temp 98.3°F | Ht 67.0 in | Wt 170.0 lb

## 2018-01-04 DIAGNOSIS — Z716 Tobacco abuse counseling: Secondary | ICD-10-CM | POA: Diagnosis not present

## 2018-01-04 DIAGNOSIS — Z720 Tobacco use: Secondary | ICD-10-CM | POA: Diagnosis not present

## 2018-01-04 DIAGNOSIS — M7918 Myalgia, other site: Secondary | ICD-10-CM | POA: Diagnosis not present

## 2018-01-04 DIAGNOSIS — M5136 Other intervertebral disc degeneration, lumbar region: Secondary | ICD-10-CM

## 2018-01-04 MED ORDER — NAPROXEN 500 MG PO TABS: 500.0000 mg | ORAL_TABLET | Freq: Two times a day (BID) | ORAL | 0 refills | Status: DC

## 2018-01-04 MED ORDER — VARENICLINE TARTRATE 0.5 MG X 11 & 1 MG X 42 PO MISC: ORAL | 0 refills | Status: DC

## 2018-01-04 NOTE — Progress Notes (Signed)
Subjective: Chief Complaint  Patient presents with  . Spasms    buttock x1 week worsening    Having pains in bilat buttocks. Has had this prior intermittent, but lately more frequent and more discomfort.   Hurts to cough or go up stairs or down stairs.    walking aggravates this.  No anal pain or discomfit, no problems with bowels related to this.   No urinary c/o.  No incontinence.  No blood in urine or stool.  No leg pain or paresthesia.    He wants to start Chantix to quit tobacco. No prior trial of this.  Has failed nicotine patch in the past.   No hx/o depression, bipolar, mood disorder or other mental health issue.      Past Medical History:  Diagnosis Date  . Chronic back pain   . Full dentures   . Hepatitis A 2016   Hep A infection?  [place], [place]  . Kidney stones   . Knee pain   . Substance abuse ([place]) 01/2018   remote past, been clean for years   Current Outpatient Medications on File Prior to Visit  Medication Sig Dispense Refill  . aspirin EC 81 MG tablet Take 1 tablet (81 mg total) by mouth daily. 90 tablet 3  . pravastatin (PRAVACHOL) 20 MG tablet TAKE 1 TABLET BY MOUTH EVERY DAY IN THE EVENING 90 tablet 3  . tamsulosin (FLOMAX) 0.4 MG CAPS capsule TAKE 1 CAPSULE (0.4 MG TOTAL) BY MOUTH DAILY AFTER SUPPER. 30 capsule 0  . sildenafil (REVATIO) 20 MG tablet 1-5 tablets (20 mg to 100 mg) prior to sexual activity daily prn (Patient not taking: Reported on 12/07/2017) 50 tablet 2   No current facility-administered medications on file prior to visit.    ROS as in subjective   Objective: BP 140/86   Pulse 91   Temp 98.3 F (36.8 C) (Oral)   Ht 5\' 7"  (1.702 m)   Wt 170 lb (77.1 kg)   SpO2 97%   BMI 26.63 kg/m     General appearance: alert, no distress, WD/WN,  Abdomen: +bs, soft, non tender, non distended, no masses, no hepatomegaly, no splenomegaly Back: non tender Musculoskeletal: tender along bilat buttocks centrally, some tightness to bilat hamstrings, mildly  decreased internal and external ROM of hips, otherwise nontender, no swelling, no obvious deformity Extremities: no edema, no cyanosis, no clubbing Pulses: 2+ symmetric, upper and lower extremities, normal cap refill Neurological: legs neurovascularly intact Psychiatric: normal affect, behavior normal, pleasant     Assessment: Encounter Diagnoses  Name Primary?  . Tobacco use Yes  . Tobacco abuse counseling   . Buttock pain   . DDD (degenerative disc disease), lumbar     Plan: Buttock pain, DDD - reviewed CT abdomen/pelvis from 2017.  Likely suspect lumbar DDD and musculoskeletal pain.   Advised daily stretching, leg lifts exercise.  can use naprosyn prn, and if not improving within 7-10 days, then call or recheck.  Tobacco use - counseled on cessation, advised counseling, begin trial of Chantix.  discussed risks/benefits and black box warnings.  F/u 31mo, sooner prn.   Keil was seen today for spasms.  Diagnoses and all orders for this visit:  Tobacco use  Tobacco abuse counseling  Buttock pain  DDD (degenerative disc disease), lumbar  Other orders -     naproxen (NAPROSYN) 500 MG tablet; Take 1 tablet (500 mg total) by mouth 2 (two) times daily with a meal. -     varenicline (CHANTIX  STARTING MONTH PAK) 0.5 MG X 11 & 1 MG X 42 tablet; Take one 0.5 mg tablet by mouth once daily for 3 days, then increase to one 0.5 mg tablet twice daily for 4 days, then increase to one 1 mg tablet twice daily.

## 2018-01-15 ENCOUNTER — Other Ambulatory Visit: Payer: Self-pay | Admitting: Medical

## 2018-01-15 NOTE — Telephone Encounter (Signed)
[facility] to [facility]. Please advise [facility]

## 2018-01-28 ENCOUNTER — Other Ambulatory Visit: Payer: Self-pay | Admitting: Medical

## 2018-02-10 ENCOUNTER — Other Ambulatory Visit: Payer: Self-pay | Admitting: Medical

## 2018-02-12 NOTE — Telephone Encounter (Signed)
[facility] chantix. Please advise [facility]

## 2018-02-13 ENCOUNTER — Other Ambulatory Visit: Payer: Self-pay | Admitting: Medical

## 2018-02-13 MED ORDER — VARENICLINE TARTRATE 1 MG PO TABS: 1.0000 mg | ORAL_TABLET | Freq: Two times a day (BID) | ORAL | 0 refills | Status: DC

## 2018-03-02 ENCOUNTER — Ambulatory Visit (AMBULATORY_SURGERY_CENTER): Payer: Self-pay

## 2018-03-02 VITALS — Ht 64.0 in | Wt 169.8 lb

## 2018-03-02 DIAGNOSIS — Z1211 Encounter for screening for malignant neoplasm of colon: Secondary | ICD-10-CM

## 2018-03-02 MED ORDER — NA SULFATE-K SULFATE-MG SULF 17.5-3.13-1.6 GM/177ML PO SOLN: 1.0000 | Freq: Once | ORAL | 0 refills | Status: AC

## 2018-03-02 NOTE — Progress Notes (Signed)
Per pt, no allergies to soy or egg products.Pt not taking any weight loss meds or using  O2 at home.  Pt refused emmi video. 

## 2018-03-05 ENCOUNTER — Encounter: Payer: Self-pay | Admitting: Internal Medicine

## 2018-03-16 ENCOUNTER — Encounter: Payer: BC Managed Care – PPO | Admitting: Internal Medicine

## 2018-03-29 IMAGING — DX DG CHEST 2V
2 series · 2 of 2 positions shown · non-contrast
Comparison: Chest radiograph 09/24/2008.

CLINICAL DATA: Patient with flu-like symptoms, fever, cough and
sore throat. Chest pain.

EXAM:
CHEST  2 VIEW

[chest pa]
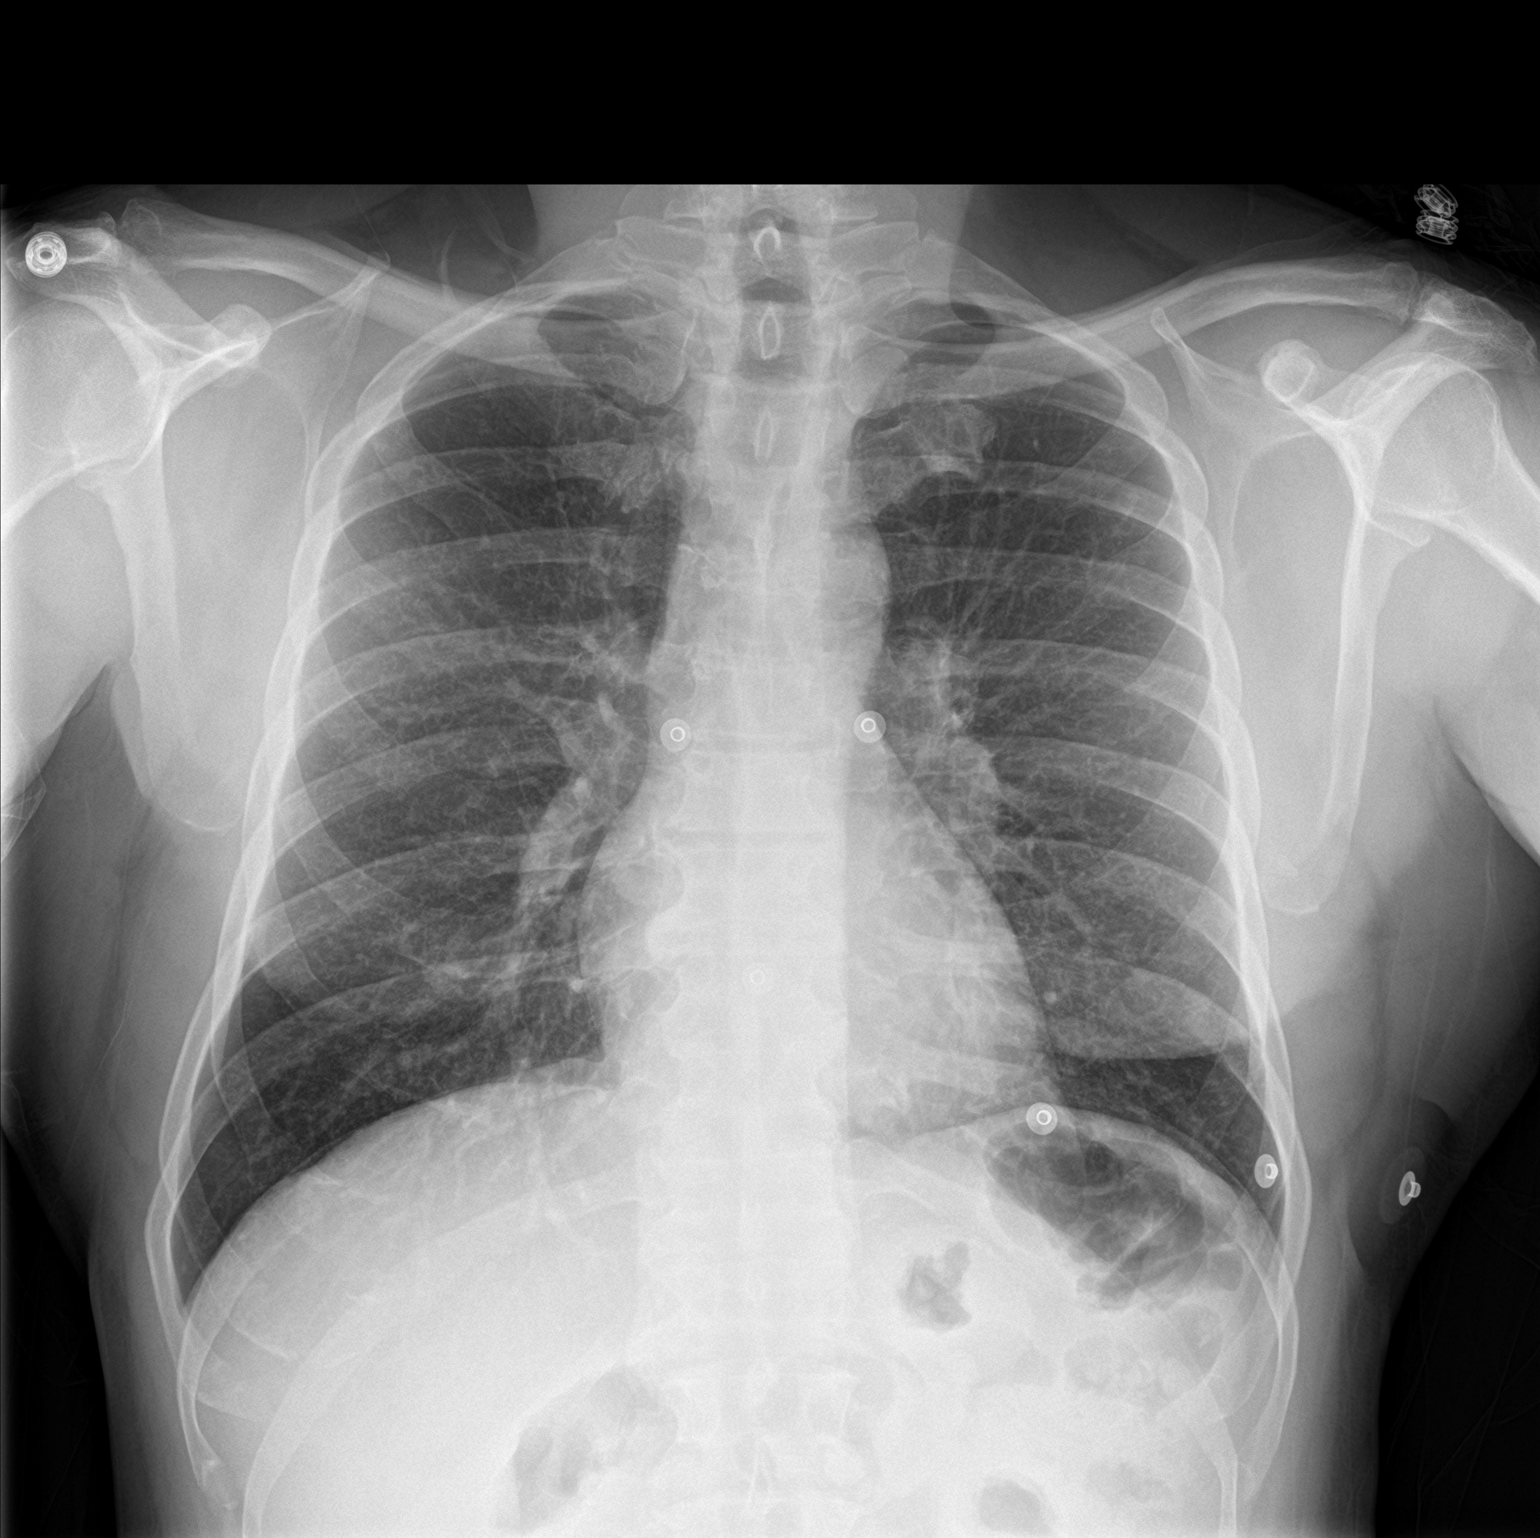

[chest lat]
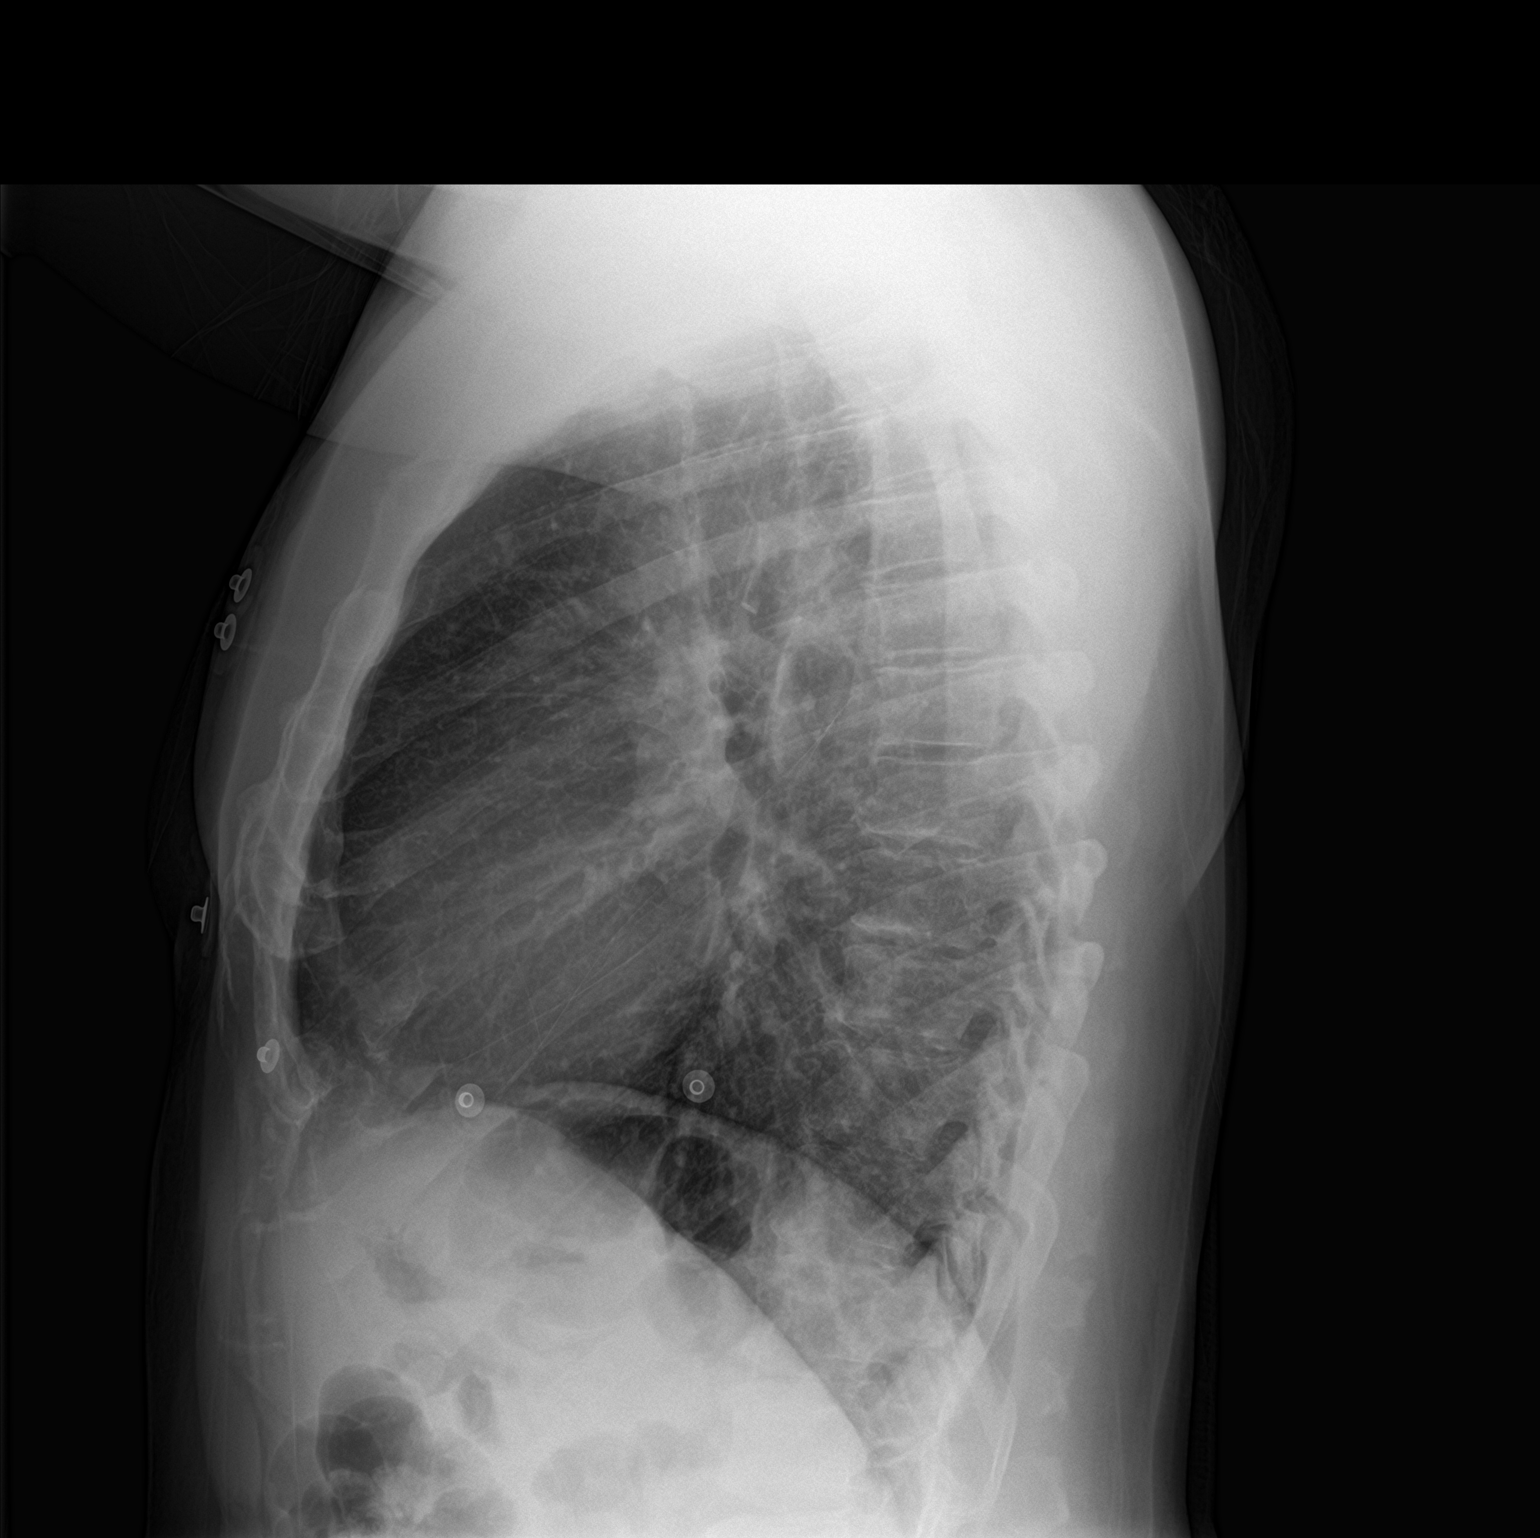

[2 of 2 positions shown; findings below may reference images not displayed]

FINDINGS: Stable cardiac and mediastinal contours. Cannot exclude subtle early
infiltrate within the right mid and upper lung. No pleural effusion
or pneumothorax. Thoracic spine degenerative changes.
IMPRESSION: Suggestion of possible early infiltrate within the right mid and
upper lung.

## 2018-04-01 ENCOUNTER — Other Ambulatory Visit: Payer: Self-pay | Admitting: Medical

## 2018-04-01 DIAGNOSIS — F17200 Nicotine dependence, unspecified, uncomplicated: Secondary | ICD-10-CM

## 2018-04-02 NOTE — Telephone Encounter (Signed)
[facility] chantix please advise [facility]

## 2018-04-02 NOTE — Telephone Encounter (Signed)
Received a refill request on Chantix.  Lets have him follow-up to see how his efforts are with quitting smoking

## 2018-04-03 ENCOUNTER — Telehealth: Payer: Self-pay

## 2018-04-03 MED ORDER — VARENICLINE TARTRATE 1 MG PO TABS: 1.0000 mg | ORAL_TABLET | Freq: Two times a day (BID) | ORAL | 0 refills | Status: DC

## 2018-04-03 NOTE — Telephone Encounter (Signed)
Called multiple times and was unable to get through, phones states that pt is unable to accept phone calls at this time

## 2018-04-03 NOTE — Telephone Encounter (Signed)
Left message on voicemail that RX has been sent into pharmacy.

## 2018-04-03 NOTE — Addendum Note (Signed)
Addended by: Gwinda Maine on: 04/03/2018 04:15 PM   Modules accepted: Orders

## 2018-04-03 NOTE — Telephone Encounter (Signed)
Pt is coming in 04/12/2018, please let me know, if you resend the refill

## 2018-04-12 ENCOUNTER — Ambulatory Visit: Payer: BC Managed Care – PPO | Admitting: Medical

## 2018-04-20 ENCOUNTER — Encounter: Payer: Self-pay | Admitting: Internal Medicine

## 2018-04-20 ENCOUNTER — Ambulatory Visit (AMBULATORY_SURGERY_CENTER): Payer: BC Managed Care – PPO | Admitting: Internal Medicine

## 2018-04-20 VITALS — BP 101/68 | HR 72 | Temp 97.5°F | Resp 12 | Ht 67.0 in | Wt 170.0 lb

## 2018-04-20 DIAGNOSIS — D123 Benign neoplasm of transverse colon: Secondary | ICD-10-CM | POA: Diagnosis not present

## 2018-04-20 DIAGNOSIS — Z1211 Encounter for screening for malignant neoplasm of colon: Secondary | ICD-10-CM

## 2018-04-20 MED ORDER — SODIUM CHLORIDE 0.9 % IV SOLN: 500.0000 mL | Freq: Once | INTRAVENOUS | Status: AC

## 2018-04-20 NOTE — Op Note (Signed)
[name]: Timothy Melton Procedure Date: 04/20/2018 3:40 PM MRN: [id number] Endoscopist: Docia Chuck. Timothy Melton , MD Age: 57 Referring MD:  Date of Birth: 1960/11/21 Gender: Male Account #: [id number] Procedure:                Colonoscopy, with cold snare polypectomy x 1 Indications:              Screening for colorectal malignant neoplasm Medicines:                Monitored Anesthesia Care Procedure:                Pre-Anesthesia Assessment:                           - Prior to the procedure, a History and Physical                            was performed, and patient medications and                            allergies were reviewed. The patient's tolerance of                            previous anesthesia was also reviewed. The risks                            and benefits of the procedure and the sedation                            options and risks were discussed with the patient.                            All questions were answered, and informed consent                            was obtained. Prior Anticoagulants: The patient has                            taken no previous anticoagulant or antiplatelet                            agents. ASA Grade Assessment: II - A patient with                            mild systemic disease. After reviewing the risks                            and benefits, the patient was deemed in                            satisfactory condition to undergo the procedure.                           After obtaining informed consent, the colonoscope  was passed under direct vision. Throughout the                            procedure, the patient's blood pressure, pulse, and                            oxygen saturations were monitored continuously. The                            Colonoscope was introduced through the anus and                            advanced to the the cecum, identified by        appendiceal orifice and ileocecal valve. The                            ileocecal valve, appendiceal orifice, and rectum                            were photographed. The quality of the bowel                            preparation was excellent. The colonoscopy was                            performed without difficulty. The patient tolerated                            the procedure well. The bowel preparation used was                            SUPREP. Scope In: 3:47:24 PM Scope Out: 4:02:05 PM Scope Withdrawal Time: 0 hours 12 minutes 57 seconds  Total Procedure Duration: 0 hours 14 minutes 41 seconds  Findings:                 A 5 mm polyp was found in the transverse colon. The                            polyp was removed with a cold snare. Resection and                            retrieval were complete.                           The exam was otherwise without abnormality on                            direct and retroflexion views. Complications:            No immediate complications. Estimated blood loss:                            None. Estimated Blood Loss:     Estimated blood loss: none. Impression:               -  One 5 mm polyp in the transverse colon, removed                            with a cold snare. Resected and retrieved.                           - The examination was otherwise normal on direct                            and retroflexion views. Recommendation:           - Repeat colonoscopy in 5or 10 years for                            surveillance, based on pathology results.                           - Patient has a contact number available for                            emergencies. The signs and symptoms of potential                            delayed complications were discussed with the                            patient. Return to normal activities tomorrow.                            Written discharge instructions were provided to the                             patient.                           - Resume previous diet.                           - Continue present medications.                           - Await pathology results. Docia Chuck. Timothy Pastor, MD 04/20/2018 4:07:18 PM This report has been signed electronically.

## 2018-04-20 NOTE — Patient Instructions (Signed)
Discharge instructions given. Handout on polyps. Resume previous medications. YOU HAD AN ENDOSCOPIC PROCEDURE TODAY AT THE  ENDOSCOPY CENTER:   Refer to the procedure report that was given to you for any specific questions about what was found during the examination.  If the procedure report does not answer your questions, please call your gastroenterologist to clarify.  If you requested that your care partner not be given the details of your procedure findings, then the procedure report has been included in a sealed envelope for you to review at your convenience later.  YOU SHOULD EXPECT: Some feelings of bloating in the abdomen. Passage of more gas than usual.  Walking can help get rid of the air that was put into your GI tract during the procedure and reduce the bloating. If you had a lower endoscopy (such as a colonoscopy or flexible sigmoidoscopy) you may notice spotting of blood in your stool or on the toilet paper. If you underwent a bowel prep for your procedure, you may not have a normal bowel movement for a few days.  Please Note:  You might notice some irritation and congestion in your nose or some drainage.  This is from the oxygen used during your procedure.  There is no need for concern and it should clear up in a day or so.  SYMPTOMS TO REPORT IMMEDIATELY:   Following lower endoscopy (colonoscopy or flexible sigmoidoscopy):  Excessive amounts of blood in the stool  Significant tenderness or worsening of abdominal pains  Swelling of the abdomen that is new, acute  Fever of 100F or higher   For urgent or emergent issues, a gastroenterologist can be reached at any hour by calling [phone].   DIET:  We do recommend a small meal at first, but then you may proceed to your regular diet.  Drink plenty of fluids but you should avoid alcoholic beverages for 24 hours.  ACTIVITY:  You should plan to take it easy for the rest of today and you should NOT DRIVE or use heavy  machinery until tomorrow (because of the sedation medicines used during the test).    FOLLOW UP: Our staff will call the number listed on your records the next business day following your procedure to check on you and address any questions or concerns that you may have regarding the information given to you following your procedure. If we do not reach you, we will leave a message.  However, if you are feeling well and you are not experiencing any problems, there is no need to return our call.  We will assume that you have returned to your regular daily activities without incident.  If any biopsies were taken you will be contacted by phone or by letter within the next 1-3 weeks.  Please call us at [phone] if you have not heard about the biopsies in 3 weeks.    SIGNATURES/CONFIDENTIALITY: You and/or your care partner have signed paperwork which will be entered into your electronic medical record.  These signatures attest to the fact that that the information above on your After Visit Summary has been reviewed and is understood.  Full responsibility of the confidentiality of this discharge information lies with you and/or your care-partner. 

## 2018-04-20 NOTE — Progress Notes (Signed)
Pt's states no medical or surgical changes since previsit or office visit. 

## 2018-04-20 NOTE — Progress Notes (Signed)
To PACU, VSS. Report to [email or web address] 

## 2018-04-20 NOTE — Progress Notes (Signed)
Called to room to assist during endoscopic procedure.  Patient ID and intended procedure confirmed with present staff. Received instructions for my participation in the procedure from the performing physician.  

## 2018-04-23 ENCOUNTER — Telehealth: Payer: Self-pay | Admitting: *Deleted

## 2018-04-23 ENCOUNTER — Telehealth: Payer: Self-pay

## 2018-04-23 ENCOUNTER — Encounter: Payer: Self-pay | Admitting: Medical

## 2018-04-23 NOTE — Telephone Encounter (Signed)
Left message

## 2018-04-23 NOTE — Telephone Encounter (Signed)
  Follow up Call-  Call back number 04/20/2018  Post procedure Call Back phone  # [phone]  Permission to leave phone message Yes  Some recent data might be hidden     Patient questions:  Message left to call us if necessary.  Second call.

## 2018-04-27 ENCOUNTER — Encounter: Payer: Self-pay | Admitting: Internal Medicine

## 2018-05-10 ENCOUNTER — Ambulatory Visit (INDEPENDENT_AMBULATORY_CARE_PROVIDER_SITE_OTHER): Payer: BC Managed Care – PPO | Admitting: Medical

## 2018-05-10 VITALS — BP 120/80 | HR 74 | Temp 97.8°F | Resp 16 | Ht 67.0 in | Wt 168.4 lb

## 2018-05-10 DIAGNOSIS — D369 Benign neoplasm, unspecified site: Secondary | ICD-10-CM

## 2018-05-10 DIAGNOSIS — M5410 Radiculopathy, site unspecified: Secondary | ICD-10-CM

## 2018-05-10 DIAGNOSIS — D123 Benign neoplasm of transverse colon: Secondary | ICD-10-CM | POA: Diagnosis not present

## 2018-05-10 DIAGNOSIS — N529 Male erectile dysfunction, unspecified: Secondary | ICD-10-CM

## 2018-05-10 DIAGNOSIS — M549 Dorsalgia, unspecified: Secondary | ICD-10-CM | POA: Diagnosis not present

## 2018-05-10 DIAGNOSIS — K635 Polyp of colon: Secondary | ICD-10-CM | POA: Insufficient documentation

## 2018-05-10 DIAGNOSIS — G8929 Other chronic pain: Secondary | ICD-10-CM

## 2018-05-10 MED ORDER — CYCLOBENZAPRINE HCL 10 MG PO TABS: 10.0000 mg | ORAL_TABLET | ORAL | 0 refills | Status: DC | PRN

## 2018-05-10 MED ORDER — SILDENAFIL CITRATE 20 MG PO TABS: ORAL_TABLET | ORAL | 2 refills | Status: AC
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
## 2018-05-24 ENCOUNTER — Ambulatory Visit
Admission: RE | Admit: 2018-05-24 | Discharge: 2018-05-24 | Disposition: A | Discharge status: Home / Self Care | Payer: BC Managed Care – PPO | Source: Ambulatory Visit | Attending: Medical | Admitting: Medical

## 2018-05-24 DIAGNOSIS — M5410 Radiculopathy, site unspecified: Secondary | ICD-10-CM

## 2018-05-24 DIAGNOSIS — G8929 Other chronic pain: Secondary | ICD-10-CM

## 2018-05-24 DIAGNOSIS — M549 Dorsalgia, unspecified: Principal | ICD-10-CM

## 2018-05-28 ENCOUNTER — Other Ambulatory Visit: Payer: Self-pay

## 2018-05-28 DIAGNOSIS — G8929 Other chronic pain: Secondary | ICD-10-CM

## 2018-05-28 DIAGNOSIS — M549 Dorsalgia, unspecified: Principal | ICD-10-CM

## 2018-06-01 ENCOUNTER — Telehealth: Payer: Self-pay | Admitting: Medical

## 2018-06-01 NOTE — Telephone Encounter (Signed)
  Pt dropped of FMLA form, sent back in folder  Please call when ready [phone]

## 2018-06-04 NOTE — Telephone Encounter (Signed)
FMLA paperwork has been completed.  Please SCAN copy for us.  Return form to patient or employer as requested.   ? ?Charge the customary fee for FMLA form completion.  ?

## 2018-06-05 ENCOUNTER — Telehealth: Payer: Self-pay | Admitting: Medical

## 2018-06-05 NOTE — Telephone Encounter (Signed)
P.A. SILDENAFIL  

## 2018-06-21 NOTE — Telephone Encounter (Signed)
Timothy Melton, [place] [phone] & all ED meds are plan exclusions.  [facility], cancelled Rx here & I activated Good Rx discount card and called into [facility] at [place] cost will be $18.   Pt informed.

## 2018-07-02 ENCOUNTER — Other Ambulatory Visit: Payer: Self-pay | Admitting: Medical

## 2018-07-02 NOTE — Telephone Encounter (Signed)
Is this ok to refill?  

## 2018-12-24 ENCOUNTER — Ambulatory Visit: Payer: BC Managed Care – PPO | Admitting: Medical

## 2019-10-07 ENCOUNTER — Encounter: Payer: Self-pay | Admitting: Medical

## 2019-10-07 ENCOUNTER — Ambulatory Visit (INDEPENDENT_AMBULATORY_CARE_PROVIDER_SITE_OTHER): Payer: BC Managed Care – PPO | Admitting: Medical

## 2019-10-07 ENCOUNTER — Other Ambulatory Visit: Payer: Self-pay

## 2019-10-07 VITALS — BP 132/72 | HR 74 | Temp 98.6°F | Ht 65.0 in | Wt 169.2 lb

## 2019-10-07 DIAGNOSIS — Z1321 Encounter for screening for nutritional disorder: Secondary | ICD-10-CM

## 2019-10-07 DIAGNOSIS — R202 Paresthesia of skin: Secondary | ICD-10-CM | POA: Diagnosis not present

## 2019-10-07 DIAGNOSIS — Z79899 Other long term (current) drug therapy: Secondary | ICD-10-CM

## 2019-10-07 DIAGNOSIS — M79671 Pain in right foot: Secondary | ICD-10-CM

## 2019-10-07 DIAGNOSIS — Z131 Encounter for screening for diabetes mellitus: Secondary | ICD-10-CM

## 2019-10-07 DIAGNOSIS — M549 Dorsalgia, unspecified: Secondary | ICD-10-CM

## 2019-10-07 DIAGNOSIS — E785 Hyperlipidemia, unspecified: Secondary | ICD-10-CM | POA: Insufficient documentation

## 2019-10-07 DIAGNOSIS — G8929 Other chronic pain: Secondary | ICD-10-CM

## 2019-10-07 DIAGNOSIS — Z0289 Encounter for other administrative examinations: Secondary | ICD-10-CM

## 2019-10-07 DIAGNOSIS — M79672 Pain in left foot: Secondary | ICD-10-CM

## 2019-10-07 MED ORDER — GABAPENTIN 100 MG PO CAPS: 200.0000 mg | ORAL_CAPSULE | Freq: Every day | ORAL | 2 refills | Status: AC

## 2019-10-07 NOTE — Progress Notes (Addendum)
Subjective: Chief Complaint  Patient presents with  . Back Pain    lower chronic  . Foot Pain    bilateral burning sensation throughout the day    Here today for recheck on chronic back pain and some new symptoms in his feet.  Still having chronic back  pains.  Continues to have chronic back pains throughout the day, often but not every day.  Wants new FMLA completed.    Was out of work from March - June due to covid restrictions.  But since been back at work, pains worse.    We had referred him to orthopedics in the past year, but he notes that they required $100 co-pay upfront which he did not have so he did not see them for appointment.  Since 2020 been having foot problems, but lately in the last few months it is more severe.  Even in the morning when he steps down getting burning, throbbing pins and needles pain feeling in both feet.   Hurts to be up walking on his feet.  He denies a specific injury or trauma.  No heavy alcohol use.  No history of vitamin deficiency.  He tried cyclobenzaprine and aspirin but neither helps.  Fiance has given him one of her hydrocodone pills every now and then 5mg  dose.   That helps.  He has also used some of wife's gabapentin which seems to help.  Hyperlipidemia - taking Pravastatin 20mg  daily.  His twin brother, fraternal, just recently had to go on insulin.  [place]er wants to be tested again for diabetes   Past Medical History:  Diagnosis Date  . Chronic back pain   . Full dentures   . Hepatitis A 2016   Hep A infection?  [place], [place]  . Hyperlipidemia   . Kidney stones   . Knee pain   . 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 cyclobenzaprine (FLEXERIL) 10 MG tablet TAKE 1 TABLET (10 MG TOTAL) BY MOUTH AS NEEDED FOR MUSCLE SPASMS. 20 tablet 0  . pravastatin (PRAVACHOL) 20 MG tablet TAKE 1 TABLET BY MOUTH EVERY DAY IN THE  EVENING 90 tablet 3  . aspirin EC 81 MG tablet Take 1 tablet (81 mg total) by mouth daily. (Patient not taking: Reported on 10/07/2019) 90 tablet 3  . naproxen (NAPROSYN) 500 MG tablet TAKE 1 TABLET (500 MG TOTAL) BY MOUTH 2 (TWO) TIMES DAILY WITH A MEAL. (Patient not taking: Reported on 10/07/2019) 30 tablet 0  . sildenafil (REVATIO) 20 MG tablet 1-5 tablets (20 mg to 100 mg) prior to sexual activity daily prn (Patient not taking: Reported on 10/07/2019) 50 tablet 2  . tamsulosin (FLOMAX) 0.4 MG CAPS capsule TAKE 1 CAPSULE (0.4 MG TOTAL) BY MOUTH DAILY AFTER SUPPER. (Patient not taking: Reported on 03/02/2018) 30 capsule 0  . varenicline (CHANTIX CONTINUING MONTH PAK) 1 MG tablet Take 1 tablet (1 mg total) by mouth 2 (two) times daily. (Patient not taking: Reported on 10/07/2019) 60 tablet 0   Current Facility-Administered Medications on File Prior to Visit  Medication Dose Route Frequency Provider Last Rate Last Admin  . 0.9 %  sodium chloride infusion  500 mL Intravenous Once Irene Shipper, MD       ROS as in subjective    Objective: BP 132/72   Pulse 74   Temp 98.6 F (37 C)   Ht  5\' 5"  (1.651 m)   Wt 169 lb 3.2 oz (76.7 kg)   SpO2 98%   BMI 28.16 kg/m   Gen: wd, wn, nad, African-American male Skin unremarkable Lungs clear Heart regular rate rhythm, normal S1-S2, no murmurs Extremity - no edema Upper extremity 2+ pulses Lower extremity 1+ pulses bilaterally He seems to be in pain going from chair to exam table today trying to keep weight off of each foot as he stands up.  Tender throughout medial lateral and volar feet bilaterally.  No obvious swelling, there is some thickened yellowish toenails throughout but no other deformity.  Ankle range of motion seems normal.  Tender in the lumbar spine bilaterally, mild pain with back flexion and extension which is about 70% of normal.  Legs nontender otherwise Lower extremity strength seems normal, DTRs normal, sensation appears to be normal  throughout, negative straight leg raise    Assessment: Encounter Diagnoses  Name Primary?  . Chronic back pain, unspecified back location, unspecified back pain laterality Yes  . Chronic pain of both feet   . Paresthesia   . Hyperlipidemia, unspecified hyperlipidemia type   . Screening for diabetes mellitus   . Encounter for vitamin deficiency screening   . High risk medication use   . Encounter for completion of form with patient      Plan: Chronic back pain-I reviewed his 2019 lumbar spine x-ray which was abnormal.  We will plan to refer again to a different orthopedist given the findings and his ongoing pain and request for FMLA.    Chronic pain of both feet, paresthesias-we discussed possible causes.  Begin gabapentin 2 capsules daily for chronic back pain and chronic pain in feet and paresthesias.   Discussed risks/benefits and proper use of medication.   This is likely due to lumbar spine radiculopathy but cannot fully rule out other causes.  We discussed that he may also need nerve conduction studies.  We will check some labs today  Hyperlipidemia-updated labs today, continue statin  I will work on his [place] was seen today for back pain and foot pain.  Diagnoses and all orders for this visit:  Chronic back pain, unspecified back location, unspecified back pain laterality -     Vitamin B12 -     CBC -     Comprehensive metabolic panel  Chronic pain of both feet -     Vitamin B12 -     CBC -     Comprehensive metabolic panel  Paresthesia -     Vitamin B12 -     Hemoglobin A1c -     CBC -     Comprehensive metabolic panel  Hyperlipidemia, unspecified hyperlipidemia type -     Comprehensive metabolic panel -     Lipid panel  Screening for diabetes mellitus -     Hemoglobin A1c  Encounter for vitamin deficiency screening -     Vitamin B12  High risk medication use  Encounter for completion of form with patient  Other orders -     gabapentin  (NEURONTIN) 100 MG capsule; Take 2 capsules (200 mg total) by mouth at bedtime.   Follow-up pending labs

## 2019-10-08 ENCOUNTER — Other Ambulatory Visit: Payer: Self-pay | Admitting: Medical

## 2019-10-08 LAB — LIPID PANEL
Chol/HDL Ratio: 5.9 ratio — ABNORMAL HIGH (ref 0.0–5.0)
Cholesterol, Total: 317 mg/dL — ABNORMAL HIGH (ref 100–199)
HDL: 54 mg/dL (ref >39)
LDL Chol Calc (NIH): 212 mg/dL — ABNORMAL HIGH (ref 0–99)
Triglycerides: 257 mg/dL — ABNORMAL HIGH (ref 0–149)
VLDL Cholesterol Cal: 51 mg/dL — ABNORMAL HIGH (ref 5–40)

## 2019-10-08 LAB — CBC
Hematocrit: 44.7 % (ref 37.5–51.0)
Hemoglobin: 15.1 g/dL (ref 13.0–17.7)
MCH: 29.7 pg (ref 26.6–33.0)
MCHC: 33.8 g/dL (ref 31.5–35.7)
MCV: 88 fL (ref 79–97)
Platelets: 276 10*3/uL (ref 150–450)
RBC: 5.08 x10E6/uL (ref 4.14–5.80)
RDW: 15.1 % (ref 11.6–15.4)
WBC: 6.2 10*3/uL (ref 3.4–10.8)

## 2019-10-08 LAB — COMPREHENSIVE METABOLIC PANEL
ALT: 11 IU/L (ref 0–44)
AST: 23 IU/L (ref 0–40)
Albumin/Globulin Ratio: 1.8 (ref 1.2–2.2)
Albumin: 4.4 g/dL (ref 3.8–4.9)
Alkaline Phosphatase: 64 IU/L (ref 39–117)
BUN/Creatinine Ratio: 13 (ref 9–20)
BUN: 16 mg/dL (ref 6–24)
Bilirubin Total: 0.2 mg/dL (ref 0.0–1.2)
CO2: 22 mmol/L (ref 20–29)
Calcium: 9.4 mg/dL (ref 8.7–10.2)
Chloride: 104 mmol/L (ref 96–106)
Creatinine, Ser: 1.23 mg/dL (ref 0.76–1.27)
GFR calc Af Amer: 74 mL/min/{1.73_m2} (ref >59)
GFR calc non Af Amer: 64 mL/min/{1.73_m2} (ref >59)
Globulin, Total: 2.5 g/dL (ref 1.5–4.5)
Glucose: 90 mg/dL (ref 65–99)
Potassium: 4.4 mmol/L (ref 3.5–5.2)
Sodium: 141 mmol/L (ref 134–144)
Total Protein: 6.9 g/dL (ref 6.0–8.5)

## 2019-10-08 LAB — HEMOGLOBIN A1C
Est. average glucose Bld gHb Est-mCnc: 117 mg/dL
Hgb A1c MFr Bld: 5.7 % — ABNORMAL HIGH (ref 4.8–5.6)

## 2019-10-08 LAB — VITAMIN B12: Vitamin B-12: 529 pg/mL (ref 232–1245)

## 2019-10-08 MED ORDER — ASPIRIN EC 81 MG PO TBEC: 81.0000 mg | DELAYED_RELEASE_TABLET | Freq: Every day | ORAL | 3 refills | Status: AC

## 2019-10-08 MED ORDER — ROSUVASTATIN CALCIUM 20 MG PO TABS: 20.0000 mg | ORAL_TABLET | Freq: Every day | ORAL | 3 refills | Status: AC

## 2019-10-09 ENCOUNTER — Other Ambulatory Visit: Payer: Self-pay

## 2019-10-09 DIAGNOSIS — G8929 Other chronic pain: Secondary | ICD-10-CM

## 2019-10-09 DIAGNOSIS — M79671 Pain in right foot: Secondary | ICD-10-CM

## 2019-10-09 DIAGNOSIS — M549 Dorsalgia, unspecified: Secondary | ICD-10-CM

## 2019-10-10 ENCOUNTER — Ambulatory Visit: Payer: BC Managed Care – PPO | Admitting: Surgery

## 2020-04-08 ENCOUNTER — Other Ambulatory Visit: Payer: Self-pay

## 2020-04-08 ENCOUNTER — Emergency Department (HOSPITAL_COMMUNITY)
Admission: EM | Admit: 2020-04-08 | Discharge: 2020-04-09 | Disposition: A | Discharge status: Home / Self Care | Payer: BC Managed Care – PPO | Attending: Emergency Medicine | Admitting: Emergency Medicine

## 2020-04-08 DIAGNOSIS — M545 Low back pain: Secondary | ICD-10-CM | POA: Diagnosis not present

## 2020-04-08 DIAGNOSIS — Z5321 Procedure and treatment not carried out due to patient leaving prior to being seen by health care provider: Secondary | ICD-10-CM | POA: Diagnosis not present

## 2020-04-08 DIAGNOSIS — H9319 Tinnitus, unspecified ear: Secondary | ICD-10-CM | POA: Insufficient documentation
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
## 2020-04-10 ENCOUNTER — Other Ambulatory Visit: Payer: Self-pay | Admitting: Adult Medicine

## 2020-04-10 DIAGNOSIS — Z87891 Personal history of nicotine dependence: Secondary | ICD-10-CM

## 2020-04-20 ENCOUNTER — Other Ambulatory Visit: Payer: Self-pay | Admitting: Adult Medicine

## 2020-04-21 ENCOUNTER — Other Ambulatory Visit: Payer: Self-pay

## 2020-04-21 ENCOUNTER — Ambulatory Visit
Admission: RE | Admit: 2020-04-21 | Discharge: 2020-04-21 | Disposition: A | Discharge status: Home / Self Care | Payer: BC Managed Care – PPO | Source: Ambulatory Visit | Attending: Adult Medicine | Admitting: Adult Medicine

## 2023-03-02 ENCOUNTER — Encounter: Payer: Self-pay | Admitting: Internal Medicine
